# Patient Record
Sex: Female | Born: 1965 | Race: White | Hispanic: Yes | Smoking: Never smoker
Health system: Southern US, Community
[De-identification: ages and names within clinical notes are randomized; demographics above are authoritative.]

## PROBLEM LIST (undated history)

## (undated) DIAGNOSIS — D219 Benign neoplasm of connective and other soft tissue, unspecified: Secondary | ICD-10-CM

## (undated) DIAGNOSIS — I10 Essential (primary) hypertension: Secondary | ICD-10-CM

## (undated) DIAGNOSIS — D649 Anemia, unspecified: Secondary | ICD-10-CM

## (undated) HISTORY — DX: Anemia, unspecified: D64.9

---

## 1997-07-17 ENCOUNTER — Emergency Department (HOSPITAL_COMMUNITY): Admission: EM | Admit: 1997-07-17 | Discharge: 1997-07-17 | Payer: Self-pay | Admitting: Emergency Medicine

## 1999-07-01 ENCOUNTER — Emergency Department (HOSPITAL_COMMUNITY): Admission: EM | Admit: 1999-07-01 | Discharge: 1999-07-01 | Payer: Self-pay | Admitting: Emergency Medicine

## 1999-12-07 ENCOUNTER — Encounter: Admission: RE | Admit: 1999-12-07 | Discharge: 1999-12-07 | Payer: Self-pay | Admitting: Obstetrics & Gynecology

## 2004-05-09 ENCOUNTER — Emergency Department (HOSPITAL_COMMUNITY): Admission: EM | Admit: 2004-05-09 | Discharge: 2004-05-09 | Payer: Self-pay | Admitting: Emergency Medicine

## 2005-04-09 ENCOUNTER — Emergency Department (HOSPITAL_COMMUNITY): Admission: EM | Admit: 2005-04-09 | Discharge: 2005-04-09 | Payer: Self-pay | Admitting: Emergency Medicine

## 2009-06-07 ENCOUNTER — Emergency Department (HOSPITAL_COMMUNITY): Admission: EM | Admit: 2009-06-07 | Discharge: 2009-06-07 | Payer: Self-pay | Admitting: Emergency Medicine

## 2011-03-05 ENCOUNTER — Encounter (HOSPITAL_COMMUNITY): Payer: Self-pay | Admitting: *Deleted

## 2011-03-05 ENCOUNTER — Emergency Department (HOSPITAL_COMMUNITY)
Admission: EM | Admit: 2011-03-05 | Discharge: 2011-03-05 | Disposition: A | Discharge status: Home / Self Care | Payer: Self-pay | Attending: Emergency Medicine | Admitting: Emergency Medicine

## 2011-03-05 DIAGNOSIS — N898 Other specified noninflammatory disorders of vagina: Secondary | ICD-10-CM | POA: Insufficient documentation

## 2011-03-05 DIAGNOSIS — N939 Abnormal uterine and vaginal bleeding, unspecified: Secondary | ICD-10-CM

## 2011-03-05 LAB — CBC
HCT: 31.9 % — ABNORMAL LOW (ref 36.0–46.0)
Hemoglobin: 10.7 g/dL — ABNORMAL LOW (ref 12.0–15.0)
MCH: 27.2 pg (ref 26.0–34.0)
MCHC: 33.5 g/dL (ref 30.0–36.0)
MCV: 81 fL (ref 78.0–100.0)
Platelets: 364 10*3/uL (ref 150–400)
RBC: 3.94 MIL/uL (ref 3.87–5.11)
RDW: 13.6 % (ref 11.5–15.5)
WBC: 8.7 10*3/uL (ref 4.0–10.5)

## 2011-03-05 LAB — URINALYSIS, ROUTINE W REFLEX MICROSCOPIC
Bilirubin Urine: NEGATIVE
Glucose, UA: NEGATIVE mg/dL
Ketones, ur: NEGATIVE mg/dL
Leukocytes, UA: NEGATIVE
Nitrite: NEGATIVE
Protein, ur: NEGATIVE mg/dL
Specific Gravity, Urine: 1.016 (ref 1.005–1.030)
Urobilinogen, UA: 0.2 mg/dL (ref 0.0–1.0)
pH: 7.5 (ref 5.0–8.0)

## 2011-03-05 LAB — PREGNANCY, URINE: Preg Test, Ur: NEGATIVE

## 2011-03-05 LAB — WET PREP, GENITAL
Clue Cells Wet Prep HPF POC: NONE SEEN
Trich, Wet Prep: NONE SEEN
Yeast Wet Prep HPF POC: NONE SEEN

## 2011-03-05 LAB — URINE MICROSCOPIC-ADD ON

## 2011-03-05 MED ORDER — OXYCODONE-ACETAMINOPHEN 5-325 MG PO TABS: 1.0000 | ORAL_TABLET | ORAL | Status: AC | PRN

## 2011-03-05 NOTE — ED Notes (Signed)
Pt in and out cath due to vaginal bleeding

## 2011-03-05 NOTE — ED Notes (Signed)
Pt in c/o abnormal vaginal bleeding that started today, states she just finished her normal period, states bleeding is heavy

## 2011-03-05 NOTE — ED Notes (Signed)
Dr Juleen China bedside to perform pelvic exam

## 2011-03-06 LAB — GC/CHLAMYDIA PROBE AMP, GENITAL
Chlamydia, DNA Probe: NEGATIVE
GC Probe Amp, Genital: NEGATIVE

## 2011-03-13 NOTE — ED Provider Notes (Signed)
History     46 year old female with vaginal bleeding. Painless. Onset yesterday.worsening today. Patient says she just finished a normal period for her a few days prior to this. No urinary complaints. No dizziness, lightheadedness, or shortness of breath. Denies history of dysfunctional uterine bleeding. Does not think she is pregnant. No unusual bleeding anywhere else. Denies use of blood thinning medications.  CSN: [id number]  Arrival date & time 03/05/11  1728   First MD Initiated Contact with Patient 03/05/11 1823      Chief Complaint  Patient presents with  . Vaginal Bleeding    (Consider location/radiation/quality/duration/timing/severity/associated sxs/prior treatment) HPI  History reviewed. No pertinent past medical history.  History reviewed. No pertinent past surgical history.  History reviewed. No pertinent family history.  History  Substance Use Topics  . Smoking status: Not on file  . Smokeless tobacco: Not on file  . Alcohol Use: Not on file    OB History    Grav Para Term Preterm Abortions TAB SAB Ect Mult Living                  Review of Systems   Review of symptoms negative unless otherwise noted in HPI.   Allergies  Tylenol  Home Medications   Current Outpatient Rx  Name Route Sig Dispense Refill  . ADULT MULTIVITAMIN W/MINERALS CH Oral Take 1 tablet by mouth daily.    . OXYCODONE-ACETAMINOPHEN 5-325 MG PO TABS Oral Take 1 tablet by mouth every 4 (four) hours as needed for pain. 10 tablet 0    BP 160/83  Pulse 68  Temp(Src) 98.6 F (37 C) (Oral)  Resp 16  SpO2 100%  Physical Exam  Nursing note and vitals reviewed. Constitutional: She appears well-developed and well-nourished. No distress.  HENT:  Head: Normocephalic and atraumatic.  Eyes: Conjunctivae are normal. Right eye exhibits no discharge. Left eye exhibits no discharge.  Neck: Neck supple.  Cardiovascular: Normal rate, regular rhythm and normal heart sounds.  Exam reveals  no gallop and no friction rub.   No murmur heard. Pulmonary/Chest: Effort normal and breath sounds normal. No respiratory distress.  Abdominal: Soft. She exhibits no distension. There is no tenderness.  Genitourinary:       Normal external genitalia. Os closed. Dark red blood with small clots in vaginal vault. Small L adnexal mass appreciated with moderate tenderness. No cmt. No cervical lesions.  Musculoskeletal: She exhibits no edema and no tenderness.  Neurological: She is alert.  Skin: Skin is warm and dry.  Psychiatric: She has a normal mood and affect. Her behavior is normal. Thought content normal.    ED Course  Procedures (including critical care time)  Labs Reviewed  CBC - Abnormal; Notable for the following:    Hemoglobin 10.7 (*)    HCT 31.9 (*)    All other components within normal limits  URINALYSIS, ROUTINE W REFLEX MICROSCOPIC - Abnormal; Notable for the following:    APPearance TURBID (*)    Hgb urine dipstick TRACE (*)    All other components within normal limits  URINE MICROSCOPIC-ADD ON - Abnormal; Notable for the following:    Squamous Epithelial / LPF FEW (*)    Bacteria, UA FEW (*)    All other components within normal limits  WET PREP, GENITAL - Abnormal; Notable for the following:    WBC, Wet Prep HPF POC RARE (*)    All other components within normal limits  PREGNANCY, URINE  GC/CHLAMYDIA PROBE AMP, GENITAL  LAB REPORT -  SCANNED   No results found.   1. Vaginal bleeding       MDM  46 year old female with vaginal bleeding. Hemoglobin 10.7. Unclear baseline. Patient is hemodynamically stable. No complaints attributable to possible anemia. Normal exam is benign. No indication for emergent imaging at this time. Gynecological followup.        Raeford Razor, MD 03/13/11 1351

## 2011-06-04 ENCOUNTER — Ambulatory Visit: Payer: Self-pay | Admitting: Family Medicine

## 2011-06-04 VITALS — BP 131/82 | HR 87 | Temp 98.6°F | Resp 16 | Ht <= 58 in | Wt 128.4 lb

## 2011-06-04 DIAGNOSIS — J329 Chronic sinusitis, unspecified: Secondary | ICD-10-CM

## 2011-06-04 DIAGNOSIS — J40 Bronchitis, not specified as acute or chronic: Secondary | ICD-10-CM

## 2011-06-04 MED ORDER — FLUTICASONE PROPIONATE 50 MCG/ACT NA SUSP: 2.0000 | Freq: Every day | NASAL | Status: DC

## 2011-06-04 MED ORDER — BENZONATATE 100 MG PO CAPS: ORAL_CAPSULE | ORAL | Status: AC

## 2011-06-04 MED ORDER — AMOXICILLIN 875 MG PO TABS: 875.0000 mg | ORAL_TABLET | Freq: Two times a day (BID) | ORAL | Status: AC

## 2011-06-04 NOTE — Progress Notes (Signed)
Subjective: Patient has had upper respiratory congestion, headache, facial pain, fever, and cough for about 5 days. She's been wheezing at times. She does not smoke. She is blowing a lot of stuff out of her nose.  Objective: TMs normal generally she looks like she doesn't feel well throat clear sinuses tender. Neck supple without nodes. Chest clear.  Assessment: Sinusitis/bronchitis  Plan: Amoxicillin, Flonase, Tessalon

## 2011-06-04 NOTE — Patient Instructions (Signed)
Sinusitis (Sinusitis) Los senos paranasales son bolsas de aire que se encuentran dentro de los huesos de la cara. La aparicin de bacterias en los senos paranasales lleva a una infeccin. Esta se denomina sinusitis.Estas infecciones generalmente son el resultado de una obstruccin en los orificios que drenan los senos.  SNTOMAS Generalmente, segn que seno se infecte, habr diferentes reas en las que puede aparecer dolor.   Los senos maxilares generalmente producen dolor detrs de los ojos.   La sinusitis frontal ocasiona dolor en el medio de la frente y sobre los ojos.  Entre otros problemas (sntomas) se incluyen:  Dolor en la zona posterior de los dientes superiores.   Una secrecin similar al pus (purulenta) proveniente de la nariz.   Toda inflamacin, calor o sensibilidad en estas mismas reas son indicios de infeccin.  TRATAMIENTO La sinusitis se diagnostica a travs del examen fsico y radiografas. Si le han tomado radiografas, asegrese de retirar los resultados. O consulte el modo en que podr obtenerlos. Su mdico le prescribir medicamentos (antibiticos). Estos medicamentos se indican para combatir la infeccin. Tambin le prescribir un descongestivo para reducir la inflamacin del seno paranasal.  INSTRUCCIONES PARA EL CUIDADO DOMICILIARIO  Utilice los medicamentos de venta libre o de prescripcin para el dolor, el malestar o la fiebre, segn se lo indique el profesional que lo asiste.   Beba gran cantidad de lquidos. Los lquidos ayudan a que las mucosas de los senos nasales drenen ms fcilmente.   Aplique bolsas de calor hmedo o hielo en las zonas ms doloridas para aliviar las molestias.   Utilice Sprays nasales salinos para ayudar a humedecer los senos nasales. Estos pueden encontrarse en la farmacia local.  SOLICITE ATENCIN MDICA DE INMEDIATO SI:  Tiene fiebre.   Dolor en aumento, dolor de cabeza intenso o dolor de dientes.   Nuseas, vmitos o  sudoracin.   Hinchazn infrecuente alrededor del rostro o problemas en la visin.  EST SEGURO QUE:   Comprende las instrucciones para el alta mdica.   Controlar su enfermedad.   Solicitar atencin mdica de inmediato segn las indicaciones.  Document Released: 11/15/2004 Document Revised: 01/25/2011 [employer] Patient Information 2012 [employer]. 

## 2012-08-30 ENCOUNTER — Emergency Department (HOSPITAL_COMMUNITY)
Admission: EM | Admit: 2012-08-30 | Discharge: 2012-08-30 | Disposition: A | Discharge status: Home / Self Care | Payer: No Typology Code available for payment source | Attending: Emergency Medicine | Admitting: Emergency Medicine

## 2012-08-30 ENCOUNTER — Encounter (HOSPITAL_COMMUNITY): Payer: Self-pay | Admitting: *Deleted

## 2012-08-30 DIAGNOSIS — I10 Essential (primary) hypertension: Secondary | ICD-10-CM | POA: Insufficient documentation

## 2012-08-30 DIAGNOSIS — S0990XA Unspecified injury of head, initial encounter: Secondary | ICD-10-CM | POA: Insufficient documentation

## 2012-08-30 DIAGNOSIS — Y92410 Unspecified street and highway as the place of occurrence of the external cause: Secondary | ICD-10-CM | POA: Insufficient documentation

## 2012-08-30 DIAGNOSIS — M542 Cervicalgia: Secondary | ICD-10-CM

## 2012-08-30 DIAGNOSIS — S46909A Unspecified injury of unspecified muscle, fascia and tendon at shoulder and upper arm level, unspecified arm, initial encounter: Secondary | ICD-10-CM | POA: Insufficient documentation

## 2012-08-30 DIAGNOSIS — Y9389 Activity, other specified: Secondary | ICD-10-CM | POA: Insufficient documentation

## 2012-08-30 DIAGNOSIS — IMO0002 Reserved for concepts with insufficient information to code with codable children: Secondary | ICD-10-CM | POA: Insufficient documentation

## 2012-08-30 DIAGNOSIS — S0993XA Unspecified injury of face, initial encounter: Secondary | ICD-10-CM | POA: Insufficient documentation

## 2012-08-30 DIAGNOSIS — S199XXA Unspecified injury of neck, initial encounter: Secondary | ICD-10-CM | POA: Insufficient documentation

## 2012-08-30 DIAGNOSIS — S4980XA Other specified injuries of shoulder and upper arm, unspecified arm, initial encounter: Secondary | ICD-10-CM | POA: Insufficient documentation

## 2012-08-30 DIAGNOSIS — S298XXA Other specified injuries of thorax, initial encounter: Secondary | ICD-10-CM | POA: Insufficient documentation

## 2012-08-30 DIAGNOSIS — Z8742 Personal history of other diseases of the female genital tract: Secondary | ICD-10-CM | POA: Insufficient documentation

## 2012-08-30 DIAGNOSIS — M62838 Other muscle spasm: Secondary | ICD-10-CM | POA: Insufficient documentation

## 2012-08-30 DIAGNOSIS — Z79899 Other long term (current) drug therapy: Secondary | ICD-10-CM | POA: Insufficient documentation

## 2012-08-30 HISTORY — DX: Benign neoplasm of connective and other soft tissue, unspecified: D21.9

## 2012-08-30 HISTORY — DX: Essential (primary) hypertension: I10

## 2012-08-30 MED ORDER — TRAMADOL HCL 50 MG PO TABS: 50.0000 mg | ORAL_TABLET | Freq: Four times a day (QID) | ORAL | Status: DC | PRN

## 2012-08-30 MED ORDER — CYCLOBENZAPRINE HCL 10 MG PO TABS: 10.0000 mg | ORAL_TABLET | Freq: Two times a day (BID) | ORAL | Status: DC | PRN

## 2012-08-30 MED ORDER — CYCLOBENZAPRINE HCL 10 MG PO TABS
10.0000 mg | ORAL_TABLET | Freq: Once | ORAL | Status: AC
  Administered 2012-08-30: 10 mg via ORAL
  Filled 2012-08-30: qty 1
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
## 2012-08-31 NOTE — ED Provider Notes (Signed)
Medical screening examination/treatment/procedure(s) were performed by non-physician practitioner and as supervising physician I was immediately available for consultation/collaboration.   Richardean Canal, MD 08/31/12 [phone]

## 2013-11-15 ENCOUNTER — Emergency Department (HOSPITAL_COMMUNITY): Payer: 59

## 2013-11-15 ENCOUNTER — Encounter (HOSPITAL_COMMUNITY): Payer: Self-pay | Admitting: Emergency Medicine

## 2013-11-15 ENCOUNTER — Emergency Department (HOSPITAL_COMMUNITY)
Admission: EM | Admit: 2013-11-15 | Discharge: 2013-11-16 | Disposition: A | Discharge status: Home / Self Care | Payer: 59 | Attending: Emergency Medicine | Admitting: Emergency Medicine

## 2013-11-15 DIAGNOSIS — Z3202 Encounter for pregnancy test, result negative: Secondary | ICD-10-CM | POA: Diagnosis not present

## 2013-11-15 DIAGNOSIS — R1031 Right lower quadrant pain: Secondary | ICD-10-CM | POA: Diagnosis present

## 2013-11-15 DIAGNOSIS — N840 Polyp of corpus uteri: Secondary | ICD-10-CM | POA: Diagnosis not present

## 2013-11-15 DIAGNOSIS — E876 Hypokalemia: Secondary | ICD-10-CM

## 2013-11-15 DIAGNOSIS — K8020 Calculus of gallbladder without cholecystitis without obstruction: Secondary | ICD-10-CM | POA: Diagnosis not present

## 2013-11-15 DIAGNOSIS — Z79899 Other long term (current) drug therapy: Secondary | ICD-10-CM | POA: Diagnosis not present

## 2013-11-15 DIAGNOSIS — I10 Essential (primary) hypertension: Secondary | ICD-10-CM | POA: Diagnosis not present

## 2013-11-15 LAB — COMPREHENSIVE METABOLIC PANEL
ALT: 16 U/L (ref 0–35)
AST: 24 U/L (ref 0–37)
Albumin: 3.8 g/dL (ref 3.5–5.2)
Alkaline Phosphatase: 53 U/L (ref 39–117)
Anion gap: 13 (ref 5–15)
BUN: 12 mg/dL (ref 6–23)
CALCIUM: 8.9 mg/dL (ref 8.4–10.5)
CO2: 27 mEq/L (ref 19–32)
CREATININE: 0.64 mg/dL (ref 0.50–1.10)
Chloride: 99 mEq/L (ref 96–112)
GFR calc non Af Amer: >90 mL/min (ref >90)
Glucose, Bld: 115 mg/dL — ABNORMAL HIGH (ref 70–99)
POTASSIUM: 2.7 meq/L — AB (ref 3.7–5.3)
SODIUM: 139 meq/L (ref 137–147)
Total Bilirubin: 0.3 mg/dL (ref 0.3–1.2)
Total Protein: 7.8 g/dL (ref 6.0–8.3)

## 2013-11-15 LAB — CBC WITH DIFFERENTIAL/PLATELET
BASOS ABS: 0 10*3/uL (ref 0.0–0.1)
BASOS PCT: 0 % (ref 0–1)
Eosinophils Absolute: 0.1 10*3/uL (ref 0.0–0.7)
Eosinophils Relative: 1 % (ref 0–5)
HEMATOCRIT: 32.4 % — AB (ref 36.0–46.0)
Hemoglobin: 9.7 g/dL — ABNORMAL LOW (ref 12.0–15.0)
LYMPHS ABS: 1.7 10*3/uL (ref 0.7–4.0)
LYMPHS PCT: 21 % (ref 12–46)
MCH: 20.1 pg — AB (ref 26.0–34.0)
MCHC: 29.9 g/dL — AB (ref 30.0–36.0)
MCV: 67.2 fL — ABNORMAL LOW (ref 78.0–100.0)
MONO ABS: 1 10*3/uL (ref 0.1–1.0)
Monocytes Relative: 12 % (ref 3–12)
Neutro Abs: 5.4 10*3/uL (ref 1.7–7.7)
Neutrophils Relative %: 66 % (ref 43–77)
PLATELETS: 427 10*3/uL — AB (ref 150–400)
RBC: 4.82 MIL/uL (ref 3.87–5.11)
RDW: 16.8 % — AB (ref 11.5–15.5)
WBC: 8.2 10*3/uL (ref 4.0–10.5)

## 2013-11-15 LAB — URINALYSIS, ROUTINE W REFLEX MICROSCOPIC
GLUCOSE, UA: NEGATIVE mg/dL
KETONES UR: 15 mg/dL — AB
Leukocytes, UA: NEGATIVE
NITRITE: NEGATIVE
PH: 6 (ref 5.0–8.0)
Protein, ur: 30 mg/dL — AB
Specific Gravity, Urine: 1.031 — ABNORMAL HIGH (ref 1.005–1.030)
Urobilinogen, UA: 1 mg/dL (ref 0.0–1.0)

## 2013-11-15 LAB — URINE MICROSCOPIC-ADD ON

## 2013-11-15 LAB — PREGNANCY, URINE: PREG TEST UR: NEGATIVE

## 2013-11-15 MED ORDER — MORPHINE SULFATE 4 MG/ML IJ SOLN
4.0000 mg | Freq: Once | INTRAMUSCULAR | Status: AC
Start: 2013-11-15 — End: 2013-11-15
  Administered 2013-11-15: 4 mg via INTRAVENOUS
  Filled 2013-11-15: qty 1

## 2013-11-15 MED ORDER — IOHEXOL 300 MG/ML  SOLN
25.0000 mL | Freq: Once | INTRAMUSCULAR | Status: AC | PRN
  Administered 2013-11-15: 25 mL via ORAL

## 2013-11-15 MED ORDER — SODIUM CHLORIDE 0.9 % IV BOLUS (SEPSIS)
1000.0000 mL | Freq: Once | INTRAVENOUS | Status: AC
  Administered 2013-11-15: 1000 mL via INTRAVENOUS

## 2013-11-15 MED ORDER — POTASSIUM CHLORIDE CRYS ER 20 MEQ PO TBCR
40.0000 meq | EXTENDED_RELEASE_TABLET | Freq: Once | ORAL | Status: AC
  Administered 2013-11-15: 40 meq via ORAL
  Filled 2013-11-15: qty 2

## 2013-11-15 MED ORDER — IOHEXOL 300 MG/ML  SOLN
100.0000 mL | Freq: Once | INTRAMUSCULAR | Status: AC | PRN
  Administered 2013-11-15: 100 mL via INTRAVENOUS

## 2013-11-15 MED ORDER — SODIUM CHLORIDE 0.9 % IV SOLN
INTRAVENOUS | Status: DC
  Administered 2013-11-15: 125 mL/h via INTRAVENOUS

## 2013-11-15 MED ORDER — ONDANSETRON HCL 4 MG/2ML IJ SOLN
4.0000 mg | Freq: Once | INTRAMUSCULAR | Status: AC
  Administered 2013-11-15: 4 mg via INTRAVENOUS
  Filled 2013-11-15: qty 2

## 2013-11-15 MED ORDER — MORPHINE SULFATE 4 MG/ML IJ SOLN
4.0000 mg | Freq: Once | INTRAMUSCULAR | Status: AC
  Administered 2013-11-16: 4 mg via INTRAVENOUS
  Filled 2013-11-15: qty 1

## 2013-11-15 NOTE — ED Notes (Signed)
Reported critical K+ 2.7 to Dr Eulis Foster.

## 2013-11-15 NOTE — ED Notes (Signed)
CT informed that pt finished contrast.

## 2013-11-15 NOTE — ED Notes (Signed)
Called main lab, K+ has not resulted in lab CMP results. Verified that K+ is 2.7. Lab states that they will result the K+ now.

## 2013-11-15 NOTE — ED Notes (Signed)
Pt speaks no [place], daughter translating in triage. Pt presents with RLQ pain, nausea, vomiting, loose stool starting Friday. Pt denies anything to eat or drink since Friday and now experienceing dizziness, cold sweats, and headache. Pt was able to eat and drink today but began feeling worse. Pt denies any bloody stool, SOB, or chest pain

## 2013-11-15 NOTE — ED Provider Notes (Signed)
CSN: [id number]     Arrival date & time 11/15/13  1944 History   First MD Initiated Contact with Patient 11/15/13 1951     Chief Complaint  Patient presents with  . Abdominal Pain  . Nausea  . Emesis     (Consider location/radiation/quality/duration/timing/severity/associated sxs/prior Treatment) HPI  Ashley Bruce is a 48 y.o. female who presents for evaluation of nausea, vomiting, diarrhea, and right lower abdominal pain, for several days. She has anorexia. She's not taken any medicine for the problem at home. No other known modifying factors.   Past Medical History  Diagnosis Date  . Hypertension   . Fibroids    History reviewed. No pertinent past surgical history. History reviewed. No pertinent family history. History  Substance Use Topics  . Smoking status: Never Smoker   . Smokeless tobacco: Not on file  . Alcohol Use: No   OB History   Grav Para Term Preterm Abortions TAB SAB Ect Mult Living                 Review of Systems  All other systems reviewed and are negative.     Allergies  Tylenol  Home Medications   Prior to Admission medications   Medication Sig Start Date End Date Taking? Authorizing Provider  hydrochlorothiazide (HYDRODIURIL) 25 MG tablet Take 25 mg by mouth daily.   Yes Historical Provider, MD  tranexamic acid (LYSTEDA) 650 MG TABS Take 1,300 mg by mouth 3 (three) times daily. Only takes for 5 days of her menstrual cycle each month.   Yes Historical Provider, MD   BP 115/71  Pulse 73  Temp(Src) 98.2 F (36.8 C) (Oral)  Resp 18  Wt 122 lb 8 oz (55.566 kg)  SpO2 100%  LMP 10/30/2013 Physical Exam  Nursing note and vitals reviewed. Constitutional: She is oriented to person, place, and time. She appears well-developed and well-nourished.  HENT:  Head: Normocephalic and atraumatic.  Eyes: Conjunctivae and EOM are normal. Pupils are equal, round, and reactive to light.  Neck: Normal range of motion and phonation normal. Neck supple.   Cardiovascular: Normal rate and regular rhythm.   Pulmonary/Chest: Effort normal and breath sounds normal. She exhibits no tenderness.  Abdominal: Soft. She exhibits no distension and no mass. There is tenderness (right lower quadrant, moderate.). There is no rebound and no guarding.  Musculoskeletal: Normal range of motion.  Neurological: She is alert and oriented to person, place, and time. She exhibits normal muscle tone.  Skin: Skin is warm and dry.  Psychiatric: She has a normal mood and affect. Her behavior is normal. Judgment and thought content normal.    ED Course  Procedures (including critical care time) Medications  0.9 %  sodium chloride infusion (125 mL/hr Intravenous New Bag/Given 11/15/13 2142)  potassium chloride SA (K-DUR,KLOR-CON) CR tablet 40 mEq (40 mEq Oral Given 11/15/13 2138)  sodium chloride 0.9 % bolus 1,000 mL (1,000 mLs Intravenous New Bag/Given 11/15/13 2133)  morphine 4 MG/ML injection 4 mg (4 mg Intravenous Given 11/15/13 2135)  ondansetron (ZOFRAN) injection 4 mg (4 mg Intravenous Given 11/15/13 2135)  iohexol (OMNIPAQUE) 300 MG/ML solution 25 mL (25 mLs Oral Contrast Given 11/15/13 2155)  iohexol (OMNIPAQUE) 300 MG/ML solution 100 mL (100 mLs Intravenous Contrast Given 11/15/13 2256)    Patient Vitals for the past 24 hrs:  BP Temp Temp src Pulse Resp SpO2 Weight  11/15/13 2310 115/71 mmHg - - 73 18 100 % -  11/15/13 2230 120/76 mmHg - - 65  15 100 % -  11/15/13 2200 123/78 mmHg - - 87 17 100 % -  11/15/13 2130 129/73 mmHg - - 73 14 100 % -  11/15/13 2100 110/62 mmHg - - 72 14 99 % -  11/15/13 2030 114/69 mmHg - - 70 15 100 % -  11/15/13 1950 129/74 mmHg 98.2 F (36.8 C) Oral 82 16 97 % 122 lb 8 oz (55.566 kg)    11:21 PM Reevaluation with update and discussion. After initial assessment and treatment, an updated evaluation reveals no further complaints. Updated on finding so far. CT imaging shows right upper quadrant, gallbladder disease, and possible  uterine abnormality that is not clearly defined. Patient agrees to proceeding with ultrasound imaging of the pelvis for clarification of the pelvic structure abnormalities seen on CT imaging. Jimma Ortman L   Labs Review Labs Reviewed  CBC WITH DIFFERENTIAL - Abnormal; Notable for the following:    Hemoglobin 9.7 (*)    HCT 32.4 (*)    MCV 67.2 (*)    MCH 20.1 (*)    MCHC 29.9 (*)    RDW 16.8 (*)    Platelets 427 (*)    All other components within normal limits  COMPREHENSIVE METABOLIC PANEL - Abnormal; Notable for the following:    Potassium 2.7 (*)    Glucose, Bld 115 (*)    All other components within normal limits  URINALYSIS, ROUTINE W REFLEX MICROSCOPIC - Abnormal; Notable for the following:    APPearance CLOUDY (*)    Specific Gravity, Urine 1.031 (*)    Hgb urine dipstick MODERATE (*)    Bilirubin Urine SMALL (*)    Ketones, ur 15 (*)    Protein, ur 30 (*)    All other components within normal limits  URINE MICROSCOPIC-ADD ON - Abnormal; Notable for the following:    Squamous Epithelial / LPF FEW (*)    Bacteria, UA FEW (*)    All other components within normal limits  PREGNANCY, URINE    Imaging Review Ct Abdomen Pelvis W Contrast  11/15/2013   CLINICAL DATA:  Right lower quadrant pain, nausea, vomiting, loose stools starting Friday.  EXAM: CT ABDOMEN AND PELVIS WITH CONTRAST  TECHNIQUE: Multidetector CT imaging of the abdomen and pelvis was performed using the standard protocol following bolus administration of intravenous contrast.  CONTRAST:  138mL OMNIPAQUE IOHEXOL 300 MG/ML  SOLN  COMPARISON:  None.  FINDINGS: Mild dependent changes in the lung bases.  The liver, spleen, pancreas, adrenal glands, kidneys, abdominal aorta, inferior vena cava, and retroperitoneal lymph nodes are unremarkable. Cholelithiasis without gallbladder wall thickening or inflammation. Stomach and small bowel are unremarkable. Scattered stool in the colon without distention. No free air or free  fluid in the abdomen. Minimal umbilical hernia containing fat.  Pelvis: The uterus is enlarged and anteverted. Prominent expansion of the endometrium with heterogeneity enhancing material present. Endometrial hyperplasia or neoplasm should be excluded. Recommend ultrasound for further evaluation. Cysts in the left ovary measuring 4.1 cm maximal diameter could also be evaluated at ultrasound. No free or loculated pelvic fluid collections. Appendix is normal. No evidence of diverticulitis. Mild degenerative changes in the lumbar spine. No destructive bone lesions.  IMPRESSION: Cholelithiasis without additional inflammatory changes in the gallbladder. Normal appendix. Expanded endometrium with heterogeneous enhancing material. Endometrial polyp or neoplasm should be excluded and ultrasound is recommended. 4.1 cm left ovarian cyst could also be evaluated at sonography.   Electronically Signed   By: Lucienne Capers M.D.   On:  11/15/2013 23:02     EKG Interpretation None      MDM   Final diagnoses:  Calculus of gallbladder without cholecystitis without obstruction  Hypokalemia    Abdominal pain, lower, CT imaging indicates gallbladder disease. Further imaging required with ultrasound to evaluate the pelvic cavity structures. The patient has incidental hypokalemia; this is likely related to her use of diuretic, for blood pressure.  Nursing Notes Reviewed/ Care Coordinated, and agree without changes. Applicable Imaging Reviewed.  Interpretation of Laboratory Data incorporated into ED treatment  Richarda Blade, MD 11/18/13 [phone]

## 2013-11-16 MED ORDER — HYDROCODONE-ACETAMINOPHEN 5-325 MG PO TABS: 1.0000 | ORAL_TABLET | ORAL | Status: DC | PRN

## 2013-11-16 MED ORDER — POTASSIUM CHLORIDE CRYS ER 20 MEQ PO TBCR: 20.0000 meq | EXTENDED_RELEASE_TABLET | Freq: Every day | ORAL | Status: DC

## 2013-11-16 NOTE — Discharge Instructions (Signed)
Clico biliar (Biliary Colic)  El clico biliar es un dolor continuo o irregular en la zona superior del abdomen. Generalmente se ubica debajo de la zona derecha de la caja torcica. Aparece cuando los clculos biliares interfieren con el flujo normal de la bilis que proviene de la vescula. La bilis es un lquido que interviene en la digestin de las [place]. Se produce en el hgado y se almacena en la vescula. Al comer, La bilis pasa desde la vescula, a travs del conducto cstico y el conducto biliar comn al intestino delgado. All se mezcla con la comida parcialmente digerida. Si un clculo obstruye alguno de esos conductos, se detiene el flujo normal de bilis. Las clulas del conducto biliar se contraen con fuerza para mover el clculo. Esto causa el dolor del clico biliar.  SNTOMAS  El paciente se queja de dolor en la zona superior del abdomen. El dolor puede ser:  En el centro de la zona superior del abdomen, justo por debajo del esternn.  En la zona superior derecha del abdomen, donde se encuentra la vescula biliar y el hgado.  Se expande hacia la espalda, hacia el omplato derecho.  Nuseas y vmitos  El dolor comienza generalmente despus de comer.  El clico biliar aparece como una demanda de bilis por parte del sistema digestivo. La demanda de bilis es mayor luego de ingerir alimentos ricos en grasas. Los sntomas tambin Geophysicist/field seismologist que han estado ayunando e ingieren abruptamente una comida abundante. La mayora de los episodios de clico biliar mejoran luego de 1 a 5 horas. Despus que se alivia el dolor ms intenso, podr seguir sintiendo un dolor moderado en el abdomen durante un lapso de 24 horas. DIAGNSTICO Luego de escuchar la descripcin de los sntomas, el mdico realizar un examen fsico. Deber prestar atencin a la zona superior del abdomen. Esta es la zona en la que se encuentra el hgado y la vescula biliar. El mdico podr observar los clculos  a travs de una ecografa. Tambin le realizaran un escaneo especializado de la vescula biliar. Le indicarn anlisis de [place], especialmente si tiene fiebre o el dolor persiste. PREVENCIN El clico biliar puede evitarse controlando los factores de riesgo que favorecen los clculos. Algunos de [employer] factores de riesgo como la herencia, el aumento de la edad y Water quality scientist son aspectos normales de la vida. La obesidad y [place] dieta rica en grasas son factores de riesgo que usted puede modificar a travs de cambios hacia un estilo de vida saludable. Las mujeres que atraviesan la menopausia y que reciben terapia de reemplazo hormonal (estrgenos) tambin tienen ms riesgo de Actor clicos biliares. TRATAMIENTO  Le prescribirn analgsicos.  Le indicarn una dieta sin grasas.  Si el primer episodio es intenso, o si los clicos aparecen nuevamente, generalmente se indica la ciruga para extirpar la vescula (colecistectoma). Este procedimiento puede realizarse a travs de pequeas incisiones utilizando un instrumento denominado laparoscopio. Muchas veces se requiere una breve estada en el hospital. Algunas personas reciben el alta el mismo da. Es el tratamiento ms ampliamente utilizado en personas que sufren dolor por clculos biliares. Es efectivo y seguro, no tiene complicaciones en ms del 90% de los [place].  Si la ciruga no puede llevarse a cabo, podrn utilizarse medicamentos para [employer] clculos. Esta medicacin es cara y puede demorar meses o aos hasta que [place]. Slo podr disolver clculos pequeos.  En algunos casos raros, se combinan estos medicamentos con un procedimiento denominado litotricia por [employer]  de choque. Este procedimiento [place] ondas de choque cuidadosamente dirigidas a romper los clculos. En muchas personas tratadas con este procedimiento, los clculos vuelven a formarse luego de [employer]. PRONSTICO Si los clculos obstruyen el conducto cstico o  conducto biliar comn, usted tiene el riesgo de sufrir episodios repetidos de clicos biliares. Tambin existe un 25% de probabilidades de desarrollar una infeccin de la vescula biliar (colecistitisaguda) o alguna otra complicacin en los siguientes 10 a 20 aos. Si ha sido sometido a [place], progrmela para el momento en que sea conveniente para usted, y para cuando no se encuentre enfermo. INSTRUCCIONES PARA EL CUIDADO DOMICILIARIO  Beba gran cantidad de lquidos claros.  Evite las comidas con mucha grasa o fritas, o cualquier alimento que empeore su dolor.  Tome los medicamentos como se le indic. SOLICITE ATENCIN MDICA SI:  Le sube la temperatura a ms de 100.5 F (38.1 C).  El dolor empeora con el [place].  Siente nuseas y [employer] comer y beber.  Tiene vmitos. SOLICITE ATENCIN MDICA DE INMEDIATO SI:  Siente dolor continuo e intenso en el abdomen, que no se alivia con medicamentos.  Siente nuseas y vmitos que no mejoran con medicamentos.  Tiene sntomas de clico biliar y comienza a Irene Shipper y escalofros. Esto puede ser un indicio de que ha desarrollado colecistitis. Comunquese con su mdico inmediatamente.  Su piel o la parte blanca del ojo se vuelven amarillas (ictericia). Document Released: 05/15/2007 Document Revised: 04/30/2011 [facility] Patient Information 2015 [place]. This information is not intended to replace advice given to you by your health care provider. Make sure you discuss any questions you have with your health care provider.  Hipokalemia ( Hypokalemia) Hipokalemia significa que el nivel de potasio en sangre es menor que lo normal. El potasio es un electrolito que ayuda a regular la cantidad de lquido del organismo. Tambin estimula la contraccin muscular y ayuda a que la funcin muscular sea la [place]. La Delorise Shiner del potasio del organismo se encuentra dentro de las clulas y slo una pequea cantidad en la sangre. Debido a  que la cantidad en la sangre es muy pequea, pequeos cambios en la sangre pueden poner en peligro la vida. CAUSAS  Antibiticos.  Diarrea o vmitos.  El uso excesivo de laxantes, lo que puede causar diarrea.  Enfermedad renal crnica.  Uso de diurticos.  Trastornos de Youth worker (bulimia).  Bajos niveles de magnesio.  Sudoracin abundante. [place].  Estreimiento.  Fatiga.  Calambres musculares.  Confusin mental.  Latidos cardacos salteados o irregulares (palpitaciones).  Hormigueo o adormecimiento. DIAGNSTICO  El mdico puede diagnosticar hipokalemia por los anlisis de [place]. Adems para controlar sus niveles de potasio, el mdico podr ordenar otros anlisis de laboratorio. TRATAMIENTO La hipokalemia puede tratarse con suplementos de potasio por va oral o realizando ajustes en sus medicamentos habituales. Si sus niveles de potasio son muy bajos, ser necesario que lo reciba a travs de una vena (IV) y se lo controle en el hospital. Ardelia Mems dieta rica en potasio tambin puede ser de [place]. Los alimentos ricos en potasio son:  [place] Pert secos, como cacahuetes y pistachos.  Semillas, como semillas de girasol y de Scientific laboratory technician.  Porotos, guisantes secos y lentejas.  Granos enteros y panes y cereales con salvado.  Lambert Mody y vegetales frescos como damascos, avocado, bananas, meln, kiwi, naranjas, esprragos y patatas.  Jugos de naranja y tomates.  Carnes rojas.  Yogur con frutas. [place]  Tome todos los medicamentos como le indic el mdico.  Siga una dieta saludable e incluya alimentos nutritivos como frutas, vegetales, nueces, granos enteros y carnes [place].  Si est tomando laxantes, asegrese de seguir las instrucciones del envase. SOLICITE ATENCIN MDICA SI:  La debilidad empeora.  Siente que el corazn late fuerte o est acelerado.  Vomita o tiene diarrea.  Tiene problemas para mantener su  nivel de glucosa en el rango normal. SOLICITE ATENCIN MDICA DE INMEDIATO SI:  Siente dolor en el pecho, le falta de aire o se siente mareado.  Vomita o tiene diarrea durante ms de 2 das.  Se desmaya. ASEGRESE DE QUE:   Comprende estas instrucciones.  Controlar su afeccin.  Recibir ayuda de inmediato si no mejora o si empeora. Document Released: 02/05/2005 Document Revised: 11/26/2012 [facility] Patient Information 2015 [place], [place]. This information is not intended to replace advice given to you by your health care provider. Make sure you discuss any questions you have with your health care provider.

## 2013-12-03 ENCOUNTER — Other Ambulatory Visit (INDEPENDENT_AMBULATORY_CARE_PROVIDER_SITE_OTHER): Payer: Self-pay | Admitting: Surgery

## 2013-12-04 NOTE — Progress Notes (Addendum)
I was unable to reach patient by phone.  Assunta Found # [phone] left  A message on voice mail.   Instructed the patient to arrive at [place] entrance at 7:50, nothing to eat or drink after midnight.   Instructed the patient to take the following medications in the am with just enough water to get them down: Pain medication. Asked the patient to call [phone], in the am if there were any questions or problems.  Monday AM.

## 2013-12-06 MED ORDER — CEFAZOLIN SODIUM-DEXTROSE 2-3 GM-% IV SOLR: 2.0000 g | INTRAVENOUS | Status: DC

## 2013-12-07 ENCOUNTER — Ambulatory Visit (HOSPITAL_COMMUNITY): Payer: 59

## 2013-12-07 ENCOUNTER — Encounter (HOSPITAL_COMMUNITY)
Admission: RE | Disposition: A | Discharge status: Home / Self Care | Payer: Self-pay | Source: Ambulatory Visit | Attending: Surgery

## 2013-12-07 ENCOUNTER — Ambulatory Visit (HOSPITAL_COMMUNITY): Payer: 59 | Admitting: Anesthesiology

## 2013-12-07 ENCOUNTER — Observation Stay (HOSPITAL_COMMUNITY)
Admission: RE | Admit: 2013-12-07 | Discharge: 2013-12-09 | Disposition: A | Discharge status: Home / Self Care | Payer: 59 | Source: Ambulatory Visit | Attending: Surgery | Admitting: Surgery

## 2013-12-07 ENCOUNTER — Encounter (HOSPITAL_COMMUNITY): Payer: Self-pay | Admitting: Anesthesiology

## 2013-12-07 ENCOUNTER — Encounter (HOSPITAL_COMMUNITY): Payer: 59 | Admitting: Anesthesiology

## 2013-12-07 DIAGNOSIS — I509 Heart failure, unspecified: Secondary | ICD-10-CM | POA: Diagnosis not present

## 2013-12-07 DIAGNOSIS — Z23 Encounter for immunization: Secondary | ICD-10-CM | POA: Insufficient documentation

## 2013-12-07 DIAGNOSIS — K8010 Calculus of gallbladder with chronic cholecystitis without obstruction: Secondary | ICD-10-CM | POA: Diagnosis not present

## 2013-12-07 DIAGNOSIS — E876 Hypokalemia: Secondary | ICD-10-CM | POA: Insufficient documentation

## 2013-12-07 DIAGNOSIS — I252 Old myocardial infarction: Secondary | ICD-10-CM | POA: Insufficient documentation

## 2013-12-07 DIAGNOSIS — Z9049 Acquired absence of other specified parts of digestive tract: Secondary | ICD-10-CM

## 2013-12-07 DIAGNOSIS — K8020 Calculus of gallbladder without cholecystitis without obstruction: Secondary | ICD-10-CM | POA: Diagnosis present

## 2013-12-07 DIAGNOSIS — Z01811 Encounter for preprocedural respiratory examination: Secondary | ICD-10-CM

## 2013-12-07 DIAGNOSIS — I10 Essential (primary) hypertension: Secondary | ICD-10-CM | POA: Diagnosis not present

## 2013-12-07 HISTORY — PX: CHOLECYSTECTOMY: SHX55

## 2013-12-07 LAB — CBC
HCT: 28.7 % — ABNORMAL LOW (ref 36.0–46.0)
Hemoglobin: 8.6 g/dL — ABNORMAL LOW (ref 12.0–15.0)
MCH: 19.6 pg — ABNORMAL LOW (ref 26.0–34.0)
MCHC: 30 g/dL (ref 30.0–36.0)
MCV: 65.5 fL — AB (ref 78.0–100.0)
PLATELETS: 435 10*3/uL — AB (ref 150–400)
RBC: 4.38 MIL/uL (ref 3.87–5.11)
RDW: 17 % — AB (ref 11.5–15.5)
WBC: 6.3 10*3/uL (ref 4.0–10.5)

## 2013-12-07 LAB — POCT I-STAT 4, (NA,K, GLUC, HGB,HCT)
Glucose, Bld: 112 mg/dL — ABNORMAL HIGH (ref 70–99)
HCT: 29 % — ABNORMAL LOW (ref 36.0–46.0)
Hemoglobin: 9.9 g/dL — ABNORMAL LOW (ref 12.0–15.0)
POTASSIUM: 2.7 meq/L — AB (ref 3.7–5.3)
Sodium: 140 mEq/L (ref 137–147)

## 2013-12-07 LAB — BASIC METABOLIC PANEL
ANION GAP: 13 (ref 5–15)
BUN: 6 mg/dL (ref 6–23)
CALCIUM: 8.9 mg/dL (ref 8.4–10.5)
CO2: 26 mEq/L (ref 19–32)
Chloride: 102 mEq/L (ref 96–112)
Creatinine, Ser: 0.54 mg/dL (ref 0.50–1.10)
GFR calc Af Amer: >90 mL/min (ref >90)
GFR calc non Af Amer: >90 mL/min (ref >90)
GLUCOSE: 87 mg/dL (ref 70–99)
POTASSIUM: 2.8 meq/L — AB (ref 3.7–5.3)
SODIUM: 141 meq/L (ref 137–147)

## 2013-12-07 LAB — HCG, SERUM, QUALITATIVE: Preg, Serum: NEGATIVE

## 2013-12-07 SURGERY — LAPAROSCOPIC CHOLECYSTECTOMY WITH INTRAOPERATIVE CHOLANGIOGRAM
Anesthesia: General

## 2013-12-07 MED ORDER — LACTATED RINGERS IV SOLN
INTRAVENOUS | Status: DC
  Administered 2013-12-07: 50 mL/h via INTRAVENOUS
  Administered 2013-12-07: 1000 mL via INTRAVENOUS
  Administered 2013-12-08: 17:00:00 via INTRAVENOUS

## 2013-12-07 MED ORDER — OXYCODONE HCL 5 MG PO TABS: 5.0000 mg | ORAL_TABLET | ORAL | Status: DC | PRN

## 2013-12-07 MED ORDER — PROPOFOL 10 MG/ML IV BOLUS
INTRAVENOUS | Status: AC
  Filled 2013-12-07: qty 20

## 2013-12-07 MED ORDER — OXYCODONE HCL 5 MG PO TABS
ORAL_TABLET | ORAL | Status: AC
  Filled 2013-12-07: qty 1

## 2013-12-07 MED ORDER — CEFAZOLIN SODIUM-DEXTROSE 2-3 GM-% IV SOLR
INTRAVENOUS | Status: AC
  Administered 2013-12-07: 2 g via INTRAVENOUS
  Filled 2013-12-07: qty 50

## 2013-12-07 MED ORDER — BUPIVACAINE-EPINEPHRINE 0.25% -1:200000 IJ SOLN
INTRAMUSCULAR | Status: DC | PRN
  Administered 2013-12-07: 20 mL

## 2013-12-07 MED ORDER — LIDOCAINE HCL (CARDIAC) 20 MG/ML IV SOLN
INTRAVENOUS | Status: AC
  Filled 2013-12-07: qty 5

## 2013-12-07 MED ORDER — OXYCODONE HCL 5 MG PO TABS
5.0000 mg | ORAL_TABLET | Freq: Once | ORAL | Status: AC | PRN
  Administered 2013-12-07: 5 mg via ORAL

## 2013-12-07 MED ORDER — LIDOCAINE HCL (CARDIAC) 20 MG/ML IV SOLN
INTRAVENOUS | Status: DC | PRN
  Administered 2013-12-07: 60 mg via INTRAVENOUS

## 2013-12-07 MED ORDER — FENTANYL CITRATE 0.05 MG/ML IJ SOLN
INTRAMUSCULAR | Status: AC
  Filled 2013-12-07: qty 5

## 2013-12-07 MED ORDER — POTASSIUM CHLORIDE 10 MEQ/100ML IV SOLN
10.0000 meq | INTRAVENOUS | Status: DC
  Administered 2013-12-07 (×4): 10 meq via INTRAVENOUS
  Filled 2013-12-07 (×4): qty 100

## 2013-12-07 MED ORDER — ONDANSETRON HCL 4 MG/2ML IJ SOLN
INTRAMUSCULAR | Status: AC
  Filled 2013-12-07: qty 2

## 2013-12-07 MED ORDER — PROPOFOL 10 MG/ML IV BOLUS
INTRAVENOUS | Status: DC | PRN
  Administered 2013-12-07: 130 mg via INTRAVENOUS

## 2013-12-07 MED ORDER — HYDROMORPHONE HCL 1 MG/ML IJ SOLN
INTRAMUSCULAR | Status: AC
  Administered 2013-12-07: 0.5 mg via INTRAVENOUS
  Filled 2013-12-07: qty 1

## 2013-12-07 MED ORDER — ONDANSETRON HCL 4 MG/2ML IJ SOLN
INTRAMUSCULAR | Status: DC | PRN
  Administered 2013-12-07: 4 mg via INTRAVENOUS

## 2013-12-07 MED ORDER — POTASSIUM CHLORIDE IN NACL 20-0.9 MEQ/L-% IV SOLN
INTRAVENOUS | Status: DC
  Administered 2013-12-07 – 2013-12-08 (×2): via INTRAVENOUS
  Filled 2013-12-07 (×3): qty 1000

## 2013-12-07 MED ORDER — SODIUM CHLORIDE 0.9 % IR SOLN
Status: DC | PRN
  Administered 2013-12-07: 1000 mL

## 2013-12-07 MED ORDER — NEOSTIGMINE METHYLSULFATE 10 MG/10ML IV SOLN
INTRAVENOUS | Status: DC | PRN
  Administered 2013-12-07: 4 mg via INTRAVENOUS

## 2013-12-07 MED ORDER — HYDROMORPHONE HCL 1 MG/ML IJ SOLN
0.2500 mg | INTRAMUSCULAR | Status: DC | PRN
  Administered 2013-12-07 (×7): 0.5 mg via INTRAVENOUS

## 2013-12-07 MED ORDER — SODIUM CHLORIDE 0.9 % IV SOLN
INTRAVENOUS | Status: DC | PRN
  Administered 2013-12-07: 11:00:00

## 2013-12-07 MED ORDER — ONDANSETRON HCL 4 MG/2ML IJ SOLN: 4.0000 mg | Freq: Four times a day (QID) | INTRAMUSCULAR | Status: DC | PRN

## 2013-12-07 MED ORDER — POTASSIUM CHLORIDE 10 MEQ/100ML IV SOLN
INTRAVENOUS | Status: DC | PRN
  Administered 2013-12-07: 10 meq via INTRAVENOUS

## 2013-12-07 MED ORDER — ONDANSETRON HCL 4 MG PO TABS: 4.0000 mg | ORAL_TABLET | Freq: Four times a day (QID) | ORAL | Status: DC | PRN

## 2013-12-07 MED ORDER — OXYCODONE HCL 5 MG PO TABS
5.0000 mg | ORAL_TABLET | ORAL | Status: DC | PRN
  Administered 2013-12-07 – 2013-12-08 (×5): 10 mg via ORAL
  Administered 2013-12-09: 5 mg via ORAL
  Filled 2013-12-07: qty 2
  Filled 2013-12-07: qty 1
  Filled 2013-12-07 (×4): qty 2

## 2013-12-07 MED ORDER — HYDROMORPHONE HCL 1 MG/ML IJ SOLN
INTRAMUSCULAR | Status: AC
  Filled 2013-12-07: qty 1

## 2013-12-07 MED ORDER — GLYCOPYRROLATE 0.2 MG/ML IJ SOLN
INTRAMUSCULAR | Status: DC | PRN
  Administered 2013-12-07: 0.6 mg via INTRAVENOUS

## 2013-12-07 MED ORDER — SUCCINYLCHOLINE CHLORIDE 20 MG/ML IJ SOLN
INTRAMUSCULAR | Status: AC
  Filled 2013-12-07: qty 1

## 2013-12-07 MED ORDER — 0.9 % SODIUM CHLORIDE (POUR BTL) OPTIME
TOPICAL | Status: DC | PRN
  Administered 2013-12-07: 1000 mL

## 2013-12-07 MED ORDER — MORPHINE SULFATE 2 MG/ML IJ SOLN: 1.0000 mg | INTRAMUSCULAR | Status: DC | PRN

## 2013-12-07 MED ORDER — ENOXAPARIN SODIUM 40 MG/0.4ML ~~LOC~~ SOLN
40.0000 mg | SUBCUTANEOUS | Status: DC
  Administered 2013-12-08 – 2013-12-09 (×2): 40 mg via SUBCUTANEOUS
  Filled 2013-12-07 (×3): qty 0.4

## 2013-12-07 MED ORDER — MIDAZOLAM HCL 2 MG/2ML IJ SOLN
INTRAMUSCULAR | Status: AC
  Filled 2013-12-07: qty 2

## 2013-12-07 MED ORDER — ROCURONIUM BROMIDE 100 MG/10ML IV SOLN
INTRAVENOUS | Status: DC | PRN
  Administered 2013-12-07: 30 mg via INTRAVENOUS

## 2013-12-07 MED ORDER — FENTANYL CITRATE 0.05 MG/ML IJ SOLN
INTRAMUSCULAR | Status: DC | PRN
  Administered 2013-12-07: 100 ug via INTRAVENOUS

## 2013-12-07 MED ORDER — POTASSIUM CHLORIDE 10 MEQ/100ML IV SOLN
10.0000 meq | Freq: Once | INTRAVENOUS | Status: AC
  Administered 2013-12-07: 10 meq via INTRAVENOUS

## 2013-12-07 MED ORDER — OXYCODONE HCL 5 MG/5ML PO SOLN: 5.0000 mg | Freq: Once | ORAL | Status: AC | PRN

## 2013-12-07 SURGICAL SUPPLY — 42 items
APL SKNCLS STERI-STRIP NONHPOA (GAUZE/BANDAGES/DRESSINGS) ×1
APPLIER CLIP 5 13 M/L LIGAMAX5 (MISCELLANEOUS) ×3
APR CLP MED LRG 5 ANG JAW (MISCELLANEOUS) ×1
BAG SPEC RTRVL LRG 6X4 10 (ENDOMECHANICALS) ×1
BANDAGE ADH SHEER 1  50/CT (GAUZE/BANDAGES/DRESSINGS) ×12 IMPLANT
BENZOIN TINCTURE PRP APPL 2/3 (GAUZE/BANDAGES/DRESSINGS) ×3 IMPLANT
CANISTER SUCTION 2500CC (MISCELLANEOUS) ×3 IMPLANT
CHLORAPREP W/TINT 26ML (MISCELLANEOUS) ×3 IMPLANT
CLIP APPLIE 5 13 M/L LIGAMAX5 (MISCELLANEOUS) ×1 IMPLANT
CLOSURE WOUND 1/2 X4 (GAUZE/BANDAGES/DRESSINGS) ×1
COVER MAYO STAND STRL (DRAPES) ×3 IMPLANT
COVER SURGICAL LIGHT HANDLE (MISCELLANEOUS) ×3 IMPLANT
DRAPE C-ARM 42X72 X-RAY (DRAPES) ×3 IMPLANT
DRAPE LAPAROSCOPIC ABDOMINAL (DRAPES) ×3 IMPLANT
DRAPE UTILITY 15X26 W/TAPE STR (DRAPE) ×6 IMPLANT
ELECT REM PT RETURN 9FT ADLT (ELECTROSURGICAL) ×3
ELECTRODE REM PT RTRN 9FT ADLT (ELECTROSURGICAL) ×1 IMPLANT
GLOVE BIO SURGEON STRL SZ 6.5 (GLOVE) ×1 IMPLANT
GLOVE BIO SURGEONS STRL SZ 6.5 (GLOVE) ×1
GLOVE SURG SIGNA 7.5 PF LTX (GLOVE) ×3 IMPLANT
GOWN STRL REUS W/ TWL LRG LVL3 (GOWN DISPOSABLE) ×2 IMPLANT
GOWN STRL REUS W/ TWL XL LVL3 (GOWN DISPOSABLE) ×1 IMPLANT
GOWN STRL REUS W/TWL LRG LVL3 (GOWN DISPOSABLE) ×6
GOWN STRL REUS W/TWL XL LVL3 (GOWN DISPOSABLE) ×3
KIT BASIN OR (CUSTOM PROCEDURE TRAY) ×3 IMPLANT
KIT ROOM TURNOVER OR (KITS) ×3 IMPLANT
NS IRRIG 1000ML POUR BTL (IV SOLUTION) ×3 IMPLANT
PAD ARMBOARD 7.5X6 YLW CONV (MISCELLANEOUS) ×3 IMPLANT
POUCH SPECIMEN RETRIEVAL 10MM (ENDOMECHANICALS) ×3 IMPLANT
SCISSORS LAP 5X35 DISP (ENDOMECHANICALS) ×3 IMPLANT
SET CHOLANGIOGRAPH 5 50 .035 (SET/KITS/TRAYS/PACK) ×3 IMPLANT
SET IRRIG TUBING LAPAROSCOPIC (IRRIGATION / IRRIGATOR) ×3 IMPLANT
SLEEVE ENDOPATH XCEL 5M (ENDOMECHANICALS) ×6 IMPLANT
SPECIMEN JAR SMALL (MISCELLANEOUS) ×3 IMPLANT
STRIP CLOSURE SKIN 1/2X4 (GAUZE/BANDAGES/DRESSINGS) ×1 IMPLANT
SUT MON AB 4-0 PC3 18 (SUTURE) ×3 IMPLANT
TOWEL OR 17X24 6PK STRL BLUE (TOWEL DISPOSABLE) ×3 IMPLANT
TOWEL OR 17X26 10 PK STRL BLUE (TOWEL DISPOSABLE) ×3 IMPLANT
TRAY LAPAROSCOPIC (CUSTOM PROCEDURE TRAY) ×3 IMPLANT
TROCAR XCEL BLUNT TIP 100MML (ENDOMECHANICALS) ×3 IMPLANT
TROCAR XCEL NON-BLD 5MMX100MML (ENDOMECHANICALS) ×3 IMPLANT
TUBING INSUFFLATION (TUBING) ×3 IMPLANT

## 2013-12-07 NOTE — Anesthesia Procedure Notes (Signed)
Procedure Name: Intubation Date/Time: 12/07/2013 10:59 AM Performed by: Kyung Rudd Pre-anesthesia Checklist: Patient identified, Emergency Drugs available, Suction available and Patient being monitored Patient Re-evaluated:Patient Re-evaluated prior to inductionOxygen Delivery Method: Circle system utilized Preoxygenation: Pre-oxygenation with 100% oxygen Intubation Type: IV induction Ventilation: Mask ventilation without difficulty Laryngoscope Size: Miller and 2 Grade View: Grade I Tube type: Oral Tube size: 7.0 mm Number of attempts: 1 Airway Equipment and Method: Stylet Placement Confirmation: positive ETCO2,  ETT inserted through vocal cords under direct vision and breath sounds checked- equal and bilateral Secured at: 21 cm Tube secured with: Tape Dental Injury: Teeth and Oropharynx as per pre-operative assessment

## 2013-12-07 NOTE — Anesthesia Preprocedure Evaluation (Addendum)
Anesthesia Evaluation  Patient identified by MRN, date of birth, ID band Patient awake    Reviewed: Allergy & Precautions, H&P , NPO status , Patient's Chart, lab work & pertinent test results  History of Anesthesia Complications Negative for: history of anesthetic complications  Airway Mallampati: III TM Distance: >3 FB Neck ROM: Full    Dental  (+) Partial Upper   Pulmonary neg pulmonary ROS,  breath sounds clear to auscultation- rhonchi        Cardiovascular hypertension, Pt. 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   Dental advisory given  Plan Discussed with: CRNA and Surgeon  Anesthesia Plan Comments:         Anesthesia Quick Evaluation

## 2013-12-07 NOTE — Transfer of Care (Signed)
Immediate Anesthesia Transfer of Care Note  Patient: Ashley Bruce  Procedure(s) Performed: Procedure(s): LAPAROSCOPIC CHOLECYSTECTOMY WITH INTRAOPERATIVE CHOLANGIOGRAM (N/A)  Patient Location: PACU  Anesthesia Type:General  Level of Consciousness: awake, alert , oriented and patient cooperative  Airway & Oxygen Therapy: Patient Spontanous Breathing and Patient connected to nasal cannula oxygen  Post-op Assessment: Report given to PACU RN, Post -op Vital signs reviewed and stable and Patient moving all extremities  Post vital signs: Reviewed and stable  Complications: No apparent anesthesia complications

## 2013-12-07 NOTE — Progress Notes (Signed)
Lunch relief by T Citty RN 

## 2013-12-07 NOTE — Op Note (Signed)
Laparoscopic Cholecystectomy with IOC Procedure Note  Indications: This patient presents with symptomatic gallbladder disease and will undergo laparoscopic cholecystectomy.  Pre-operative Diagnosis: Calculus of gallbladder without mention of cholecystitis or obstruction  Post-operative Diagnosis: Same  Surgeon: Coralie Keens A   Assistants: 0  Anesthesia: General endotracheal anesthesia  ASA Class: 2  Procedure Details  The patient was seen again in the Holding Room. The risks, benefits, complications, treatment options, and expected outcomes were discussed with the patient. The possibilities of reaction to medication, pulmonary aspiration, perforation of viscus, bleeding, recurrent infection, finding a normal gallbladder, the need for additional procedures, failure to diagnose a condition, the possible need to convert to an open procedure, and creating a complication requiring transfusion or operation were discussed with the patient. The likelihood of improving the patient's symptoms with return to their baseline status is good.  The patient and/or family concurred with the proposed plan, giving informed consent. The site of surgery properly noted. The patient was taken to Operating Room, identified as Ashley Bruce and the procedure verified as Laparoscopic Cholecystectomy with Intraoperative Cholangiogram. A Time Out was held and the above information confirmed.  Prior to the induction of general anesthesia, antibiotic prophylaxis was administered. General endotracheal anesthesia was then administered and tolerated well. After the induction, the abdomen was prepped with Chloraprep and draped in the sterile fashion. The patient was positioned in the supine position.  Local anesthetic agent was injected into the skin near the umbilicus and an incision made. We dissected down to the abdominal fascia with blunt dissection.  The fascia was incised vertically and we entered the peritoneal  cavity bluntly.  A pursestring suture of 0-Vicryl was placed around the fascial opening.  The Hasson cannula was inserted and secured with the stay suture.  Pneumoperitoneum was then created with CO2 and tolerated well without any adverse changes in the patient's vital signs. An 5-mm port was placed in the subxiphoid position.  Two 5-mm ports were placed in the right upper quadrant. All skin incisions were infiltrated with a local anesthetic agent before making the incision and placing the trocars.   We positioned the patient in reverse Trendelenburg, tilted slightly to the patient's left.  The gallbladder was identified, the fundus grasped and retracted cephalad. Adhesions were lysed bluntly and with the electrocautery where indicated, taking care not to injure any adjacent organs or viscus. The infundibulum was grasped and retracted laterally, exposing the peritoneum overlying the triangle of Calot. This was then divided and exposed in a blunt fashion. A critical view of the cystic duct and cystic artery was obtained.  The cystic duct was clearly identified and bluntly dissected circumferentially. The cystic duct was ligated with a clip distally.   An incision was made in the cystic duct and the [facility] cholangiogram catheter introduced. The catheter was secured using a clip. A cholangiogram was then obtained which showed good visualization of the distal and proximal biliary tree with no sign of filling defects or obstruction.  Contrast flowed easily into the duodenum. The catheter was then removed.   The cystic duct was then ligated with clips and divided. The cystic artery was identified, dissected free, ligated with clips and divided as well.   The gallbladder was dissected from the liver bed in retrograde fashion with the electrocautery. The gallbladder was removed and placed in an Endocatch sac. The liver bed was irrigated and inspected. Hemostasis was achieved with the electrocautery. Copious irrigation  was utilized and was repeatedly aspirated until clear.  The  gallbladder and Endocatch sac were then removed through the umbilical port site.  The pursestring suture was used to close the umbilical fascia.    We again inspected the right upper quadrant for hemostasis.  Pneumoperitoneum was released as we removed the trocars.  4-0 Monocryl was used to close the skin.   Benzoin, steri-strips, and clean dressings were applied. The patient was then extubated and brought to the recovery room in stable condition. Instrument, sponge, and needle counts were correct at closure and at the conclusion of the case.   Findings: Cholecystitis with Cholelithiasis  Estimated Blood Loss: Minimal         Drains: 0         Specimens: Gallbladder           Complications: None; patient tolerated the procedure well.         Disposition: PACU - hemodynamically stable.         Condition: stable

## 2013-12-07 NOTE — Progress Notes (Signed)
Pt rates post op pain 8/10 when awakened, sleeps when left alone (interpreter assisted). Dr Ninfa Linden updated with pt's pain c/o. No new orders at pres.

## 2013-12-07 NOTE — Progress Notes (Signed)
When awakened, pt says pain 7-8/10, other wise, she sleeps when left alone. Dr Ermalene Postin updated-OK to move pt to Phase 2. Will cont to monitor.

## 2013-12-07 NOTE — Progress Notes (Signed)
Dr. Ninfa Linden phoned, informed of K+ of 2.7 after 4 runs of potassium (by I-stat)  Ordered to give an additional 4 runs.

## 2013-12-07 NOTE — Anesthesia Postprocedure Evaluation (Signed)
  Anesthesia Post-op Note  Patient: Ashley Bruce  Procedure(s) Performed: Procedure(s): LAPAROSCOPIC CHOLECYSTECTOMY WITH INTRAOPERATIVE CHOLANGIOGRAM (N/A)  Patient Location: PACU  Anesthesia Type:General  Level of Consciousness: awake  Airway and Oxygen Therapy: Patient Spontanous Breathing  Post-op Pain: moderate  Post-op Assessment: Post-op Vital signs reviewed, Patient's Cardiovascular Status Stable, Respiratory Function Stable, Patent Airway, No signs of Nausea or vomiting and Pain level controlled  Post-op Vital Signs: Reviewed and stable  Last Vitals:  Filed Vitals:   12/07/13 1330  BP: 149/82  Pulse: 66  Temp:   Resp: 14    Complications: No apparent anesthesia complications

## 2013-12-07 NOTE — H&P (Signed)
aquel Lando 12/03/2013 4:17 PM Location: [facility] Patient #: [id number] DOB: Apr 28, 1965 Widowed / Language: Spanish / Race: Undefined Female  History of Present Illness (Lallie Strahm A. Ninfa Linden MD; 12/03/2013 4:58 PM) Patient words: gallstones.  The patient is a 48 year old female who presents with symptomatic choledocholithiasis. This is a very pleasant female referred by the emergency department for systematic cholelithiasis. She presented there on September 27 with right-sided abdominal pain, nausea, and vomiting. Since then, she has continued to feel poorly. She did not speak [place]. Her daughter-in-law is here as an Astronomer. She feels like she may be slightly jaundiced. She has slight nausea but no further emesis. She is otherwise without complaints   Past Surgical History Marjean Donna, [place]; 12/03/2013 4:19 PM) No pertinent past surgical history  Diagnostic Studies History Marjean Donna, [place]; 12/03/2013 4:19 PM) Mammogram within last year Pap Smear 1-5 years ago  Allergies Marjean Donna, CMA; 12/03/2013 4:19 PM) No Known Drug Allergies10/15/2015  Medication History Davy Pique Bynum, CMA; 12/03/2013 4:19 PM) Hydrochlorothiazide (25MG  Tablet, Oral daily) Active. Tranexamic Acid (650MG  Tablet, Oral daily) Active.  Family History Marjean Donna, CMA; 12/03/2013 4:19 PM) Alcohol Abuse Brother, Father. Bleeding disorder Daughter, Sister. Hypertension Brother, Mother, Sister.  Pregnancy / Birth History Marjean Donna, [place]; 12/03/2013 4:19 PM) Age at menarche 67 years. Gravida 2 Irregular periods Maternal age 61-25 Para 2  Review of Systems ([place]; 12/03/2013 4:19 PM) General Present- Appetite Loss, Chills, Fatigue, Night Sweats and Weight Loss. Not Present- Fever and Weight Gain. Skin Not Present- Change in Wart/Mole, Dryness, Hives, Jaundice, New Lesions, Non-Healing Wounds, Rash and Ulcer. HEENT Present- Ringing in the Ears, Sore Throat and  Wears glasses/contact lenses. Not Present- Earache, Hearing Loss, Hoarseness, Nose Bleed, Oral Ulcers, Seasonal Allergies, Sinus Pain, Visual Disturbances and Yellow Eyes. Respiratory Not Present- Bloody sputum, Chronic Cough, Difficulty Breathing, Snoring and Wheezing. Breast Not Present- Breast Mass, Breast Pain, Nipple Discharge and Skin Changes. Cardiovascular Present- Leg Cramps. Not Present- Chest Pain, Difficulty Breathing Lying Down, Palpitations, Rapid Heart Rate, Shortness of Breath and Swelling of Extremities. Gastrointestinal Present- Abdominal Pain, Bloating, Excessive gas, Gets full quickly at meals, Indigestion and Nausea. Not Present- Bloody Stool, Change in Bowel Habits, Chronic diarrhea, Constipation, Difficulty Swallowing, Hemorrhoids, Rectal Pain and Vomiting. Female Genitourinary Present- Nocturia, Painful Urination, Pelvic Pain and Urgency. Not Present- Frequency. Musculoskeletal Present- Back Pain. Not Present- Joint Pain, Joint Stiffness, Muscle Pain, Muscle Weakness and Swelling of Extremities. Neurological Present- Headaches. Not Present- Decreased Memory, Fainting, Numbness, Seizures, Tingling, Tremor, Trouble walking and Weakness. Psychiatric Present- Change in Sleep Pattern and Frequent crying. Not Present- Anxiety, Bipolar, Depression and Fearful. Endocrine Not Present- Cold Intolerance, Excessive Hunger, Hair Changes, Heat Intolerance, Hot flashes and New Diabetes. Hematology Not Present- Easy Bruising, Excessive bleeding, Gland problems, HIV and Persistent Infections.   Vitals (Sonya Bynum CMA; 12/03/2013 4:20 PM) 12/03/2013 4:19 PM Weight: 123 lb Height: 59in Body Surface Area: 1.52 m Body Mass Index: 24.84 kg/m Temp.: 98.58F(Temporal)  Pulse: 72 (Regular)  BP: 118/70 (Sitting, Left Arm, Standard)    Physical Exam (Tommie Dejoseph A. Ninfa Linden MD; 12/03/2013 5:01 PM) General Note: She is comfortable in appearance   Head and Neck Note: External ears  and nose are normal, hearing is normal, oropharynx is clear, Neck is supple, trachea is midline   Eye Note: Pupils reactive bilaterally, it is difficult to tell but there may be very slight icterus   Chest and Lung Exam Note: Clear to auscultation bilaterally with normal respiratory effort  Cardiovascular Note: Regular rate and rhythm with no murmurs and no peripheral edema   Abdomen Note: Abdomen is soft and nondistended. There is very mild tenderness and guarding in the right mid-upper quadrant   Peripheral Vascular Note: Pulses intact throughout with no edema clubbing or cyanosis   Neurologic Note: Awake, alert, and oriented x3   Neuropsychiatric Note: Judgment and affect are normal   Musculoskeletal Note: No long bone abnormalities     Assessment & Plan (Jalie Eiland A. Ninfa Linden MD; 12/03/2013 5:02 PM) SYMPTOMATIC CHOLELITHIASIS (574.20  K80.20) Impression: I discussed this in detail with the patient through her daughter-in-law as an interpreter. Laparoscopic cholecystectomy with cholangiogram is urgently recommended. I discussed going ahead and putting her in the hospital but she refused and wants to wait for next week to do surgery. I discussed laparoscopic cholecystectomy with her in detail and gave her literature regarding the surgery. I discussed the risks which includes but is not limited to bleeding, infection, bile duct injury, bile leak, injury to other structures, the need to convert to an open procedure, etc. I also discussed the possibility of retained bile stone. I also discussed postoperative recovery. Surgery is scheduled urgently Current Plans  Started Percocet 5-325MG , 1 (one) Tablet every four hours, as needed, #30, 12/03/2013, No Refill. Started Zofran 4MG , 1 (one) Tablet every six hours, as needed, #20, 12/03/2013, No Refill.

## 2013-12-07 NOTE — Interval H&P Note (Signed)
History and Physical Interval Note: no change in H and P  12/07/2013 9:00 AM  Ashley Bruce  has presented today for surgery, with the diagnosis of Symptomatic Cholelithiasis  The various methods of treatment have been discussed with the patient and family. After consideration of risks, benefits and other options for treatment, the patient has consented to  Procedure(s): LAPAROSCOPIC CHOLECYSTECTOMY WITH INTRAOPERATIVE CHOLANGIOGRAM (N/A) as a surgical intervention .  The patient's history has been reviewed, patient examined, no change in status, stable for surgery.  I have reviewed the patient's chart and labs.  Questions were answered to the patient's satisfaction.     [name] A

## 2013-12-08 ENCOUNTER — Encounter (HOSPITAL_COMMUNITY): Payer: Self-pay | Admitting: General Practice

## 2013-12-08 DIAGNOSIS — K8010 Calculus of gallbladder with chronic cholecystitis without obstruction: Secondary | ICD-10-CM | POA: Diagnosis not present

## 2013-12-08 LAB — COMPREHENSIVE METABOLIC PANEL
ALK PHOS: 40 U/L (ref 39–117)
ALT: 26 U/L (ref 0–35)
AST: 41 U/L — ABNORMAL HIGH (ref 0–37)
Albumin: 3.2 g/dL — ABNORMAL LOW (ref 3.5–5.2)
Anion gap: 10 (ref 5–15)
BUN: 3 mg/dL — ABNORMAL LOW (ref 6–23)
CO2: 26 meq/L (ref 19–32)
CREATININE: 0.53 mg/dL (ref 0.50–1.10)
Calcium: 8.2 mg/dL — ABNORMAL LOW (ref 8.4–10.5)
Chloride: 106 mEq/L (ref 96–112)
GFR calc Af Amer: >90 mL/min (ref >90)
Glucose, Bld: 88 mg/dL (ref 70–99)
Potassium: 3.3 mEq/L — ABNORMAL LOW (ref 3.7–5.3)
Sodium: 142 mEq/L (ref 137–147)
Total Bilirubin: 0.5 mg/dL (ref 0.3–1.2)
Total Protein: 6.5 g/dL (ref 6.0–8.3)

## 2013-12-08 LAB — CBC
HCT: 26.3 % — ABNORMAL LOW (ref 36.0–46.0)
Hemoglobin: 7.9 g/dL — ABNORMAL LOW (ref 12.0–15.0)
MCH: 20.4 pg — AB (ref 26.0–34.0)
MCHC: 30 g/dL (ref 30.0–36.0)
MCV: 67.8 fL — AB (ref 78.0–100.0)
PLATELETS: 386 10*3/uL (ref 150–400)
RBC: 3.88 MIL/uL (ref 3.87–5.11)
RDW: 17.4 % — AB (ref 11.5–15.5)
WBC: 7.2 10*3/uL (ref 4.0–10.5)

## 2013-12-08 MED ORDER — POTASSIUM CHLORIDE CRYS ER 20 MEQ PO TBCR
20.0000 meq | EXTENDED_RELEASE_TABLET | ORAL | Status: AC
  Administered 2013-12-08: 20 meq via ORAL
  Filled 2013-12-08: qty 1

## 2013-12-08 MED ORDER — INFLUENZA VAC SPLIT QUAD 0.5 ML IM SUSY
0.5000 mL | PREFILLED_SYRINGE | INTRAMUSCULAR | Status: AC
  Administered 2013-12-09: 0.5 mL via INTRAMUSCULAR
  Filled 2013-12-08: qty 0.5

## 2013-12-08 MED ORDER — POTASSIUM CHLORIDE CRYS ER 20 MEQ PO TBCR
30.0000 meq | EXTENDED_RELEASE_TABLET | Freq: Once | ORAL | Status: AC
  Administered 2013-12-08: 30 meq via ORAL
  Filled 2013-12-08 (×4): qty 1

## 2013-12-08 NOTE — Progress Notes (Signed)
I have seen and examined the patient and agree with the assessment and plans.  Charlies Rayburn A. Adriene Padula  MD, FACS  

## 2013-12-08 NOTE — Progress Notes (Signed)
Pt has been ambulating out in the hall today and has tolerated walking well.  Will continue to monitor. Syliva Overman

## 2013-12-08 NOTE — Progress Notes (Signed)
1 Day Post-Op  Subjective: She is pretty sore and has not been up much, full liquids for breakfast.  IV still going.    Objective: Vital signs in last 24 hours: Temp:  [97.7 F (36.5 C)-98.7 F (37.1 C)] 98.1 F (36.7 C) (10/20 0510) Pulse Rate:  [59-89] 71 (10/20 0510) Resp:  [10-21] 17 (10/20 0510) BP: (111-153)/(67-85) 111/69 mmHg (10/20 0510) SpO2:  [96 %-100 %] 96 % (10/20 0510)   Full liquid diet Afebrile, VSS K+ 3.3 Anemic H/H is down to 7.9/26 this AM IOC is negative Intake/Output from previous day: 10/19 0701 - 10/20 0700 In: 1176.7 [I.V.:876.7; IV Piggyback:300] Out: -  Intake/Output this shift:    General appearance: alert, cooperative and no distress Resp: clear to auscultation bilaterally GI: soft sore, +BS, port sites all look good.  Lab Results:   Recent Labs  12/07/13 0919 12/07/13 1556 12/08/13 0505  WBC 6.3  --  7.2  HGB 8.6* 9.9* 7.9*  HCT 28.7* 29.0* 26.3*  PLT 435*  --  386    BMET  Recent Labs  12/07/13 0919 12/07/13 1556 12/08/13 0505  NA 141 140 142  K 2.8* 2.7* 3.3*  CL 102  --  106  CO2 26  --  26  GLUCOSE 87 112* 88  BUN 6  --  3*  CREATININE 0.54  --  0.53  CALCIUM 8.9  --  8.2*   PT/INR No results found for this basename: LABPROT, INR,  in the last 72 hours   Recent Labs Lab 12/08/13 0505  AST 41*  ALT 26  ALKPHOS 40  BILITOT 0.5  PROT 6.5  ALBUMIN 3.2*     Lipase  No results found for this basename: lipase     Studies/Results: Dg Chest 2 View  12/07/2013   CLINICAL DATA:  Preoperative examination. Patient for cholecystectomy.  EXAM: CHEST  2 VIEW  COMPARISON:  PA and lateral chest 05/09/2004.  FINDINGS: Heart size and mediastinal contours are within normal limits. Both lungs are clear. Visualized skeletal structures are unremarkable.  IMPRESSION: Negative exam.   Electronically Signed   By: Inge Rise M.D.   On: 12/07/2013 09:15   Dg Cholangiogram Operative  12/07/2013   CLINICAL DATA:   Laparoscopic cholecystectomy. Intraoperative cholangiogram.  EXAM: INTRAOPERATIVE CHOLANGIOGRAM  FLUOROSCOPY TIME:  11 seconds  COMPARISON:  CT abdomen pelvis - 11/15/2013  FINDINGS: Intraoperative angiographic images of the right upper abdominal quadrant during laparoscopic cholecystectomy are provided for review.  Surgical clips overlie the expected location of the gallbladder fossa.  Contrast injection demonstrates selective cannulation of the central aspect of the cystic duct.  There is passage of contrast through the central aspect of the cystic duct with filling of a non dilated common bile duct. There is passage of contrast though the CBD and into the descending portion of the duodenum.  There is minimal reflux of injected contrast into the common hepatic duct and central aspect of the non dilated intrahepatic biliary system.  There are no discrete filling defects within the opacified portions of the biliary system to suggest the presence of choledocholithiasis.  IMPRESSION: No evidence of choledocholithiasis.   Electronically Signed   By: Sandi Mariscal M.D.   On: 12/07/2013 14:51    Medications: . enoxaparin (LOVENOX) injection  40 mg Subcutaneous Q24H  . [START ON 12/09/2013] Influenza vac split quadrivalent PF  0.5 mL Intramuscular Tomorrow-1000    Assessment/Plan Calculus of gallbladder without mention of cholecystitis or obstruction  Laparoscopic Cholecystectomy with  IOC, 12/07/13, Dr. Coralie Keens  Hypokalemia  i will replace this.   Plan:  Mobilize, advance diet and if OK home after lunch.     LOS: 1 day    Ashley Bruce 12/08/2013

## 2013-12-09 DIAGNOSIS — K8010 Calculus of gallbladder with chronic cholecystitis without obstruction: Secondary | ICD-10-CM | POA: Diagnosis not present

## 2013-12-09 NOTE — Progress Notes (Signed)
Patient discharged to home with instructions. 

## 2013-12-09 NOTE — Discharge Summary (Signed)
Patient IDJaiyah Bruce MRN: [id number] DOB/AGE: 11/02/1965 48 y.o.  Admit date: 12/07/2013 Discharge date: 12/09/2013  Procedures: lap chole with IOC  Consults: None  Reason for Admission: The patient is a 48 year old female who presents with symptomatic choledocholithiasis. This is a very pleasant female referred by the emergency department for systematic cholelithiasis. She presented there on September 27 with right-sided abdominal pain, nausea, and vomiting. Since then, she has continued to feel poorly. She did not speak [place]. Her daughter-in-law is here as an Astronomer. She feels like she may be slightly jaundiced. She has slight nausea but no further emesis. She is otherwise without complaints  Admission Diagnoses:  1. Symptomatic cholelithiasis  Hospital Course: The patient was admitted and taken to the OR where she underwent a lap chole with IOC.  She tolerated it well, but her pain was not well controlled on POD 1.  She was kept for pain control.  She was eating solid food well though.  On POD 2, her pain was better controlled.  She was stable for dc home at this time.  PE: Abd: soft, appropriately tender, +BS, ND, incisions c/d/i  Discharge Diagnoses:  Active Problems:   S/P laparoscopic cholecystectomy   Discharge Medications:   Medication List    STOP taking these medications       HYDROcodone-acetaminophen 5-325 MG per tablet  Commonly known as:  NORCO/VICODIN      TAKE these medications       hydrochlorothiazide 25 MG tablet  Commonly known as:  HYDRODIURIL  Take 25 mg by mouth daily.     oxyCODONE 5 MG immediate release tablet  Commonly known as:  Oxy IR/ROXICODONE  Take 1 tablet (5 mg total) by mouth every 4 (four) hours as needed for severe pain.     potassium chloride SA 20 MEQ tablet  Commonly known as:  K-DUR,KLOR-CON  Take 1 tablet (20 mEq total) by mouth daily.     tranexamic acid 650 MG Tabs tablet  Commonly known as:  LYSTEDA  Take  1,300 mg by mouth 3 (three) times daily. Only takes for 5 days of her menstrual cycle each month.        Discharge Instructions:     Follow-up Information   Follow up with [facility] A, MD. Schedule an appointment as soon as possible for a visit in 3 weeks.   Specialty:  General Surgery   Contact information:   [address] [place] [address] [phone]       Signed: Henreitta Cea 12/09/2013, 9:04 AM

## 2013-12-09 NOTE — Discharge Summary (Signed)
I have seen and examined the patient and agree with the assessment and plans.  Ermal Brzozowski A. Sharhonda Atwood  MD, FACS  

## 2013-12-09 NOTE — Discharge Instructions (Signed)
[facility] ______[facility] °LAPAROSCOPIC SURGERY: POST OP INSTRUCTIONS °Always review your discharge instruction sheet given to you by the facility where your surgery was performed. °IF YOU HAVE DISABILITY OR FAMILY LEAVE FORMS, YOU MUST BRING THEM TO THE OFFICE FOR PROCESSING.   °DO NOT GIVE THEM TO YOUR DOCTOR. ° °1. A prescription for pain medication may be given to you upon discharge.  Take your pain medication as prescribed, if needed.  If narcotic pain medicine is not needed, then you may take acetaminophen (Tylenol) or ibuprofen (Advil) as needed. °2. Take your usually prescribed medications unless otherwise directed. °3. If you need a refill on your pain medication, please contact your pharmacy.  They will contact our office to request authorization. Prescriptions will not be filled after 5pm or on week-ends. °4. You should follow a light diet the first few days after arrival home, such as soup and crackers, etc.  Be sure to include lots of fluids daily. °5. Most patients will experience some swelling and bruising in the area of the incisions.  Ice packs will help.  Swelling and bruising can take several days to resolve.  °6. It is common to experience some constipation if taking pain medication after surgery.  Increasing fluid intake and taking a stool softener (such as Colace) will usually help or prevent this problem from occurring.  A mild laxative (Milk of Magnesia or Miralax) should be taken according to package instructions if there are no bowel movements after 48 hours. °7. Unless discharge instructions indicate otherwise, you may remove your bandages 24-48 hours after surgery, and you may shower at that time.  You may have steri-strips (small skin tapes) in place directly over the incision.  These strips should be left on the skin for 7-10 days.  If your surgeon used skin glue on the incision, you may shower in 24 hours.  The glue will flake off over the next 2-3 weeks.  Any sutures or  staples will be removed at the office during your follow-up visit. °8. ACTIVITIES:  You may resume regular (light) daily activities beginning the next day--such as daily self-care, walking, climbing stairs--gradually increasing activities as tolerated.  You may have sexual intercourse when it is comfortable.  Refrain from any heavy lifting or straining until approved by your doctor. °a. You may drive when you are no longer taking prescription pain medication, you can comfortably wear a seatbelt, and you can safely maneuver your car and apply brakes. °b. RETURN TO WORK:  __________________________________________________________ °9. You should see your doctor in the office for a follow-up appointment approximately 2-3 weeks after your surgery.  Make sure that you call for this appointment within a day or two after you arrive home to insure a convenient appointment time. °10. OTHER INSTRUCTIONS: __________________________________________________________________________________________________________________________ __________________________________________________________________________________________________________________________ °WHEN TO CALL YOUR DOCTOR: °1. Fever over 101.0 °2. Inability to urinate °3. Continued bleeding from incision. °4. Increased pain, redness, or drainage from the incision. °5. Increasing abdominal pain ° °The clinic staff is available to answer your questions during regular business hours.  Please don’t hesitate to call and ask to speak to one of the nurses for clinical concerns.  If you have a medical emergency, go to the nearest emergency room or call 911.  A surgeon from [facility] is always on call at the hospital. °[address], [address], [place], [place]  [address] ? [address] [address], [place],    [address] °[phone] ? [phone] ? FAX [phone] °Web site:   [email or web address]  What to eat:  For your first meals, you should eat  lightly; only small meals initially.  If you do not have nausea, you may eat larger meals.  Avoid spicy, greasy and heavy food.    General Anesthesia, Adult, Care After  Refer to this sheet in the next few weeks. These instructions provide you with information on caring for yourself after your procedure. Your health care provider may also give you more specific instructions. Your treatment has been planned according to current medical practices, but problems sometimes occur. Call your health care provider if you have any problems or questions after your procedure.  WHAT TO EXPECT AFTER THE PROCEDURE  After the procedure, it is typical to experience:  Sleepiness.  Nausea and vomiting. HOME CARE INSTRUCTIONS  For the first 24 hours after general anesthesia:  Have a responsible person with you.  Do not drive a car. If you are alone, do not take public transportation.  Do not drink alcohol.  Do not take medicine that has not been prescribed by your health care provider.  Do not sign important papers or make important decisions.  You may resume a normal diet and activities as directed by your health care provider.  Change bandages (dressings) as directed.  If you have questions or problems that seem related to general anesthesia, call the hospital and ask for the anesthetist or anesthesiologist on call. SEEK MEDICAL CARE IF:  You have nausea and vomiting that continue the day after anesthesia.  You develop a rash. SEEK IMMEDIATE MEDICAL CARE IF:  You have difficulty breathing.  You have chest pain.  You have any allergic problems. Document Released: 05/14/2000 Document Revised: 10/08/2012 Document Reviewed: 08/21/2012  [facility] Patient Information 2014 [place], [place].   CIRUGIA LAPAROSCOPICA: INSTRUCCIONES DE POST OPERATORIO.  Revise siempre los documentos que le entreguen en el lugar donde se ha hecho la [place].  SI USTED NECESITA DOCUMENTOS DE INCAPACIDAD (DISABLE) O DE PERMISO FAMILAR  (FAMILY LEAVE) NECESITA TRAERLOS A LA OFICINA PARA QUE SEAN PROCESADOS. NO  SE LOS DE A SU DOCTOR. 11. A su alta del hospital se le dara una receta para Financial controller. Tomela como ha sido recetada, si la necesita. Si no la necesita puede tomar, Acetaminofen (Tylenol) o Ibuprofen (Advil) para aliviar dolor moderado. 12. Continue tomando el resto de sus medicinas. 13. Si necesita rellenar la receta, llame a la farmacia. ellos contactan a nuestra oficina pidiendo autorizacion. Este tipo de receta no pueden ser [employer] de las  5pm o [employer] fines de [place]. 14. Con relacion a la dieta: debe ser [employer] primeros dias despues que llege a la casa. Ejemplo: sopas y galleticas. Tome bastante liquido esos dias. 61. La mayoria de los pacientes padecen de inflamacion y cambio de coloracion de la piel alrededor de las incisiones. esto toma dias en resolver.  pnerse una bolsa de hielo en el area affectada ayuda..  58. Es comun tambien tener un poco de estrenimiento si esta tomado medicinas para Conservation officer, historic buildings. incremente la cantidad de liquidos a tomar y Doctor, hospital (Colace) esto previene el problema. Si ya tiene estrenimiento, es Software engineer no ha defecado en 48 horas, puede tomar un laxativo (Milk of Magnesia or Miralax) uselo como el paquete le explica. 70.  A menos que se le diga algo diferente. Remueva el bendaje a las 24-48 horas despues dela [place]. y puede banarse en la ducha sin ningun problema. usted puede tener steri-strips (pequenas curitas transparentes en la piel  puesta encima de la incision)  Estas banditas strips should be left on the skin for 7-10 days.   Si su cirujano puso pegamento encima de la incision usted puede banarse bajo la ducha en 24 horas. Este pegamento empezara a caerse en las proximas 2-3 semanas. Si le pusieron suturas o presillas (grapos) estos seran quitados en su proxima cita en la oficina. Marland Kitchen a. ACTIVIDADES:  Puede hacer actividad ligera.  Como caminar , subir escaleras y  poco a poco irlas incrementando tanto como las [place]. Puede tener relaciones sexuales cuando sea comfortable. No carge objetos pesados o haga esfuerzos que no sean aprovados por su doctor. b. Puede manejar en cuanto no esta tomando medicamentos fuertes (narcoticos) para Conservation officer, historic buildings, pueda abrochar confortablemente el cinturon de seguridad, y pueda Psychologist, counselling y usar los pedales de su vehiculo con seguridad. c. [place] A TRABAJAR  18. Debe ver a su doctor para una cita de seguimiento en 2-3 semanas despues de la [place].  60. OTRAS ISNSTRUCCIONES:___________________________________________________________________________________ Angelina Pih A SU MEDICO: 6. FIEBRE mayor de  101.0 7. No produccion de [place]. 8. Sangramiento continue de la herida 9. Incremento de dolor, enrojecimientio o drenaje de la herida (incision) 10. Incremento de dolor abdominal.  The clinic staff is available to answer your questions during regular business hours.  Please dont hesitate to call and ask to speak to one of the nurses for clinical concerns.  If you have a medical emergency, go to the nearest emergency room or call 911.  A surgeon from [facility] is always on call at the hospital. [address], [place], [place], [place]  [address] ? [address], [place], [place]   [address] [phone] ? [phone] ? FAX [phone] Web site: [email or web address]

## 2014-01-01 ENCOUNTER — Emergency Department (HOSPITAL_COMMUNITY): Payer: 59

## 2014-01-01 ENCOUNTER — Encounter (HOSPITAL_COMMUNITY): Payer: Self-pay | Admitting: Family Medicine

## 2014-01-01 ENCOUNTER — Emergency Department (HOSPITAL_COMMUNITY)
Admission: EM | Admit: 2014-01-01 | Discharge: 2014-01-02 | Disposition: A | Discharge status: Home / Self Care | Payer: 59 | Attending: Emergency Medicine | Admitting: Emergency Medicine

## 2014-01-01 DIAGNOSIS — I10 Essential (primary) hypertension: Secondary | ICD-10-CM | POA: Diagnosis not present

## 2014-01-01 DIAGNOSIS — R110 Nausea: Secondary | ICD-10-CM | POA: Insufficient documentation

## 2014-01-01 DIAGNOSIS — R10813 Right lower quadrant abdominal tenderness: Secondary | ICD-10-CM | POA: Insufficient documentation

## 2014-01-01 DIAGNOSIS — Z79899 Other long term (current) drug therapy: Secondary | ICD-10-CM | POA: Diagnosis not present

## 2014-01-01 DIAGNOSIS — Z8742 Personal history of other diseases of the female genital tract: Secondary | ICD-10-CM | POA: Insufficient documentation

## 2014-01-01 DIAGNOSIS — R109 Unspecified abdominal pain: Secondary | ICD-10-CM | POA: Diagnosis not present

## 2014-01-01 DIAGNOSIS — N858 Other specified noninflammatory disorders of uterus: Secondary | ICD-10-CM | POA: Diagnosis not present

## 2014-01-01 DIAGNOSIS — N9489 Other specified conditions associated with female genital organs and menstrual cycle: Secondary | ICD-10-CM

## 2014-01-01 LAB — URINE MICROSCOPIC-ADD ON

## 2014-01-01 LAB — COMPREHENSIVE METABOLIC PANEL
ALT: 7 U/L (ref 0–35)
AST: 13 U/L (ref 0–37)
Albumin: 3.4 g/dL — ABNORMAL LOW (ref 3.5–5.2)
Alkaline Phosphatase: 70 U/L (ref 39–117)
Anion gap: 12 (ref 5–15)
BUN: 11 mg/dL (ref 6–23)
CO2: 23 mEq/L (ref 19–32)
Calcium: 8.6 mg/dL (ref 8.4–10.5)
Chloride: 105 mEq/L (ref 96–112)
Creatinine, Ser: 0.53 mg/dL (ref 0.50–1.10)
GFR calc Af Amer: >90 mL/min (ref >90)
GFR calc non Af Amer: >90 mL/min (ref >90)
Glucose, Bld: 92 mg/dL (ref 70–99)
Potassium: 3.6 mEq/L — ABNORMAL LOW (ref 3.7–5.3)
Sodium: 140 mEq/L (ref 137–147)
Total Bilirubin: 0.3 mg/dL (ref 0.3–1.2)
Total Protein: 7.2 g/dL (ref 6.0–8.3)

## 2014-01-01 LAB — CBC WITH DIFFERENTIAL/PLATELET
BASOS PCT: 1 % (ref 0–1)
Basophils Absolute: 0.1 10*3/uL (ref 0.0–0.1)
EOS PCT: 3 % (ref 0–5)
Eosinophils Absolute: 0.2 10*3/uL (ref 0.0–0.7)
HCT: 26.3 % — ABNORMAL LOW (ref 36.0–46.0)
HEMOGLOBIN: 7.8 g/dL — AB (ref 12.0–15.0)
LYMPHS PCT: 34 % (ref 12–46)
Lymphs Abs: 2.5 10*3/uL (ref 0.7–4.0)
MCH: 19.7 pg — ABNORMAL LOW (ref 26.0–34.0)
MCHC: 29.7 g/dL — ABNORMAL LOW (ref 30.0–36.0)
MCV: 66.6 fL — ABNORMAL LOW (ref 78.0–100.0)
Monocytes Absolute: 0.7 10*3/uL (ref 0.1–1.0)
Monocytes Relative: 10 % (ref 3–12)
Neutro Abs: 3.8 10*3/uL (ref 1.7–7.7)
Neutrophils Relative %: 52 % (ref 43–77)
Platelets: 469 10*3/uL — ABNORMAL HIGH (ref 150–400)
RBC: 3.95 MIL/uL (ref 3.87–5.11)
RDW: 17.6 % — ABNORMAL HIGH (ref 11.5–15.5)
WBC: 7.3 10*3/uL (ref 4.0–10.5)

## 2014-01-01 LAB — URINALYSIS, ROUTINE W REFLEX MICROSCOPIC
Bilirubin Urine: NEGATIVE
GLUCOSE, UA: NEGATIVE mg/dL
Ketones, ur: NEGATIVE mg/dL
Leukocytes, UA: NEGATIVE
Nitrite: NEGATIVE
PROTEIN: NEGATIVE mg/dL
Specific Gravity, Urine: 1.017 (ref 1.005–1.030)
Urobilinogen, UA: 0.2 mg/dL (ref 0.0–1.0)
pH: 7.5 (ref 5.0–8.0)

## 2014-01-01 LAB — POC URINE PREG, ED: Preg Test, Ur: NEGATIVE

## 2014-01-01 LAB — LIPASE, BLOOD: Lipase: 71 U/L — ABNORMAL HIGH (ref 11–59)

## 2014-01-01 MED ORDER — IOHEXOL 300 MG/ML  SOLN
100.0000 mL | Freq: Once | INTRAMUSCULAR | Status: AC | PRN
  Administered 2014-01-01: 100 mL via INTRAVENOUS

## 2014-01-01 MED ORDER — ONDANSETRON HCL 4 MG/2ML IJ SOLN
4.0000 mg | Freq: Once | INTRAMUSCULAR | Status: AC
  Administered 2014-01-01: 4 mg via INTRAVENOUS
  Filled 2014-01-01: qty 2

## 2014-01-01 MED ORDER — SODIUM CHLORIDE 0.9 % IV BOLUS (SEPSIS)
1000.0000 mL | Freq: Once | INTRAVENOUS | Status: AC
  Administered 2014-01-01: 1000 mL via INTRAVENOUS

## 2014-01-01 MED ORDER — MORPHINE SULFATE 4 MG/ML IJ SOLN
4.0000 mg | Freq: Once | INTRAMUSCULAR | Status: AC
  Administered 2014-01-01: 4 mg via INTRAVENOUS
  Filled 2014-01-01: qty 1

## 2014-01-01 MED ORDER — IOHEXOL 300 MG/ML  SOLN
25.0000 mL | Freq: Once | INTRAMUSCULAR | Status: AC | PRN
  Administered 2014-01-01: 25 mL via ORAL

## 2014-01-01 NOTE — ED Notes (Signed)
Informed CT that pt was done drinking her contrast

## 2014-01-01 NOTE — ED Notes (Signed)
Per pt family pt having RUQ pain. sts gallbladder surgery  3 weeks ago. sts some nausea. sts area hurts when her clothes rub on the area.

## 2014-01-01 NOTE — ED Provider Notes (Signed)
CSN: [id number]     Arrival date & time 01/01/14  1752 History   First MD Initiated Contact with Patient 01/01/14 2033     Chief Complaint  Patient presents with  . Abdominal Pain     (Consider location/radiation/quality/duration/timing/severity/associated sxs/prior Treatment) HPI  48 year old female presents with 3 days of right-sided abdominal pain. It is intermittent though it is present for most of the day. Does not necessarily worsen with eating. Has been nauseous without vomiting. 3 weeks ago had a cholecystectomy for symptomatic gallstones. Patient states the pain is similar to what she had with her gallbladder. Had a fever of 102 yesterday. Patient has not had any urinary symptoms. No constipation or diarrhea. Pain is a 7 out of 10.  Past Medical History  Diagnosis Date  . Hypertension   . Fibroids    Past Surgical History  Procedure Laterality Date  . Cholecystectomy  12/07/2013  . Cesarean section    . Cholecystectomy N/A 12/07/2013    Procedure: LAPAROSCOPIC CHOLECYSTECTOMY WITH INTRAOPERATIVE CHOLANGIOGRAM;  Surgeon: Coralie Keens, MD;  Location: [facility];  Service: General;  Laterality: N/A;   History reviewed. No pertinent family history. History  Substance Use Topics  . Smoking status: Never Smoker   . Smokeless tobacco: Never Used  . Alcohol Use: No   OB History    No data available     Review of Systems  Constitutional: Positive for fever.  Gastrointestinal: Positive for nausea and abdominal pain. Negative for vomiting, diarrhea and constipation.  Genitourinary: Negative for dysuria.  All other systems reviewed and are negative.     Allergies  Tylenol  Home Medications   Prior to Admission medications   Medication Sig Start Date End Date Taking? Authorizing Provider  hydrochlorothiazide (HYDRODIURIL) 25 MG tablet Take 25 mg by mouth daily.    Historical Provider, MD  oxyCODONE (OXY IR/ROXICODONE) 5 MG immediate release tablet Take 1 tablet (5 mg  total) by mouth every 4 (four) hours as needed for severe pain. 12/07/13   Coralie Keens, MD  potassium chloride SA (K-DUR,KLOR-CON) 20 MEQ tablet Take 1 tablet (20 mEq total) by mouth daily. 11/16/13   Richarda Blade, MD  tranexamic acid (LYSTEDA) 650 MG TABS Take 1,300 mg by mouth 3 (three) times daily. Only takes for 5 days of her menstrual cycle each month.    Historical Provider, MD   BP 136/74 mmHg  Pulse 81  Temp(Src) 97.4 F (36.3 C)  Resp 16  Wt 127 lb 9 oz (57.862 kg)  SpO2 100%  LMP 12/29/2013 Physical Exam  Constitutional: She is oriented to person, place, and time. She appears well-developed and well-nourished. No distress.  HENT:  Head: Normocephalic and atraumatic.  Right Ear: External ear normal.  Left Ear: External ear normal.  Nose: Nose normal.  Eyes: Right eye exhibits no discharge. Left eye exhibits no discharge.  Cardiovascular: Normal rate, regular rhythm and normal heart sounds.   Pulmonary/Chest: Effort normal and breath sounds normal.  Abdominal: Soft. There is tenderness in the right lower quadrant.  Laparoscopic wounds appear well, no focal erythema or drainage  Neurological: She is alert and oriented to person, place, and time.  Skin: Skin is warm and dry.  Nursing note and vitals reviewed.   ED Course  Procedures (including critical care time) Labs Review Labs Reviewed  CBC WITH DIFFERENTIAL - Abnormal; Notable for the following:    Hemoglobin 7.8 (*)    HCT 26.3 (*)    MCV 66.6 (*)  MCH 19.7 (*)    MCHC 29.7 (*)    RDW 17.6 (*)    Platelets 469 (*)    All other components within normal limits  COMPREHENSIVE METABOLIC PANEL - Abnormal; Notable for the following:    Potassium 3.6 (*)    Albumin 3.4 (*)    All other components within normal limits  LIPASE, BLOOD - Abnormal; Notable for the following:    Lipase 71 (*)    All other components within normal limits  URINALYSIS, ROUTINE W REFLEX MICROSCOPIC - Abnormal; Notable for the  following:    APPearance TURBID (*)    Hgb urine dipstick LARGE (*)    All other components within normal limits  URINE MICROSCOPIC-ADD ON - Abnormal; Notable for the following:    Squamous Epithelial / LPF FEW (*)    Bacteria, UA MANY (*)    All other components within normal limits  POC URINE PREG, ED    Imaging Review Ct Abdomen Pelvis W Contrast  01/01/2014   CLINICAL DATA:  Initial evaluation for right upper quadrant pain, underwent gallbladder resection 3 weeks ago  EXAM: CT ABDOMEN AND PELVIS WITH CONTRAST  TECHNIQUE: Multidetector CT imaging of the abdomen and pelvis was performed using the standard protocol following bolus administration of intravenous contrast.  CONTRAST:  135mL OMNIPAQUE IOHEXOL 300 MG/ML  SOLN  COMPARISON:  11/15/2013, CT and ultrasound  FINDINGS: Visualized lung bases clear.  No acute musculoskeletal findings.  Status post cholecystectomy. Mild intrahepatic biliary dilatation. Common bile duct 12 mm. No calculi identified within the common bile duct.  Liver otherwise normal. Spleen and pancreas normal. Kidneys and adrenal glands normal.  Abdominal aorta normal. Stomach, small bowel, and large bowel normal. Appendix normal.  No ascites. Bladder normal. Ovaries normal. There is an intrauterine enhancing mass measuring about 4.4 cm located within the endometrium. This was seen previously. It appears mildly larger.  IMPRESSION: 1. No acute abnormality. Mild biliary dilatation likely related to status post cholecystectomy. 2. Endometrial polypoid lesion. Differential considerations include polyp or carcinoma. Surgical consultation suggested. It appears mildly larger when compared to prior study.   Electronically Signed   By: Skipper Cliche M.D.   On: 01/01/2014 22:55     EKG Interpretation None      MDM   Final diagnoses:  Abdominal pain    Patient's pain controlled in ED. No obvious source for her pain. No dysuria. No fevers here. WBC normal, anemia at baseline  post-op. No signs of abscess, and has normal appendix on CT. Has adequate amount of pain meds at home. Will recommend she f/u with her GYN given endometrial findings and return to ED if pain returns or worsens.    Ephraim Hamburger, MD 01/02/14 [phone]

## 2014-01-01 NOTE — Discharge Instructions (Signed)
Dolor abdominal (Abdominal Pain) El dolor puede tener muchas causas. Normalmente la causa del dolor abdominal no es una enfermedad y Teacher, English as a foreign language sin Clinical research associate. Frecuentemente puede controlarse y tratarse en casa. Su mdico le Chartered certified accountant examen fsico y posiblemente solicite anlisis de sangre y radiografas para ayudar a Teacher, adult education la gravedad de su dolor. Sin embargo, en [employer], debe transcurrir ms tiempo antes de que se pueda Pension scheme manager una causa evidente del dolor. Antes de llegar a ese punto, es posible que su mdico no sepa si necesita ms pruebas o un tratamiento ms profundo. INSTRUCCIONES PARA EL CUIDADO EN EL HOGAR  Est atento al dolor para ver si hay cambios. Las siguientes indicaciones ayudarn a Chief Strategy Officer que pueda sentir:  [place] solo medicamentos de venta libre o recetados, segn las indicaciones del mdico.  No tome laxantes a menos que se lo haya indicado su mdico.  Pruebe con Ardelia Mems dieta lquida absoluta (caldo, t o agua) segn se lo indique su mdico. Introduzca gradualmente una dieta normal, segn su tolerancia. SOLICITE ATENCIN MDICA SI:  Tiene dolor abdominal sin explicacin.  Tiene dolor abdominal relacionado con nuseas o diarrea.  Tiene dolor cuando orina o defeca.  Experimenta dolor abdominal que lo despierta de noche.  Tiene dolor abdominal que empeora o mejora cuando come alimentos.  Tiene dolor abdominal que empeora cuando come alimentos grasosos.  Tiene fiebre. SOLICITE ATENCIN MDICA DE INMEDIATO SI:   El dolor no desaparece en un plazo mximo de 2horas.  No deja de (vomitar).  El Social research officer, government se siente solo en partes del abdomen, como el lado derecho o la parte inferior izquierda del abdomen.  Evaca materia fecal sanguinolenta o negra, de aspecto alquitranado. ASEGRESE DE QUE:  Comprende estas instrucciones.  Controlar su afeccin.  Recibir ayuda de inmediato si no mejora o si empeora. Document Released: 02/05/2005  Document Revised: 02/10/2013 [facility] Patient Information 2015 [place]. This information is not intended to replace advice given to you by your health care provider. Make sure you discuss any questions you have with your health care provider.   Fibroma uterino (Uterine Fibroid) Un fibroma uterino es un crecimiento (tumor) dentro del tero. Este tipo de tumor no es Radio broadcast assistant y no se extiende fuera del tero. Podr tener uno o varios fibromas. Los fibromas pueden variar en tamao, peso y TEFL teacher en que se desarrollan dentro del tero. Algunos pueden llegar a ser bastante grandes. La mayora de los fibromas no necesitan tratamiento mdico, pero algunos pueden causar dolor o sangrado abundante durante los perodos y [place]. CAUSAS  Un fibroma es el resultado del desarrollo continuo de una nica clula uterina que sigue creciendo (no regulada) que es diferente al resto de las clulas del cuerpo humano. La mayora de las clulas tiene un mecanismo de control que evita que se reproduzcan de Research officer, trade union.  SIGNOS Y SNTOMAS   Hemorragias.  Dolor y sensacin de presin en la pelvis.  Problemas en la vejiga debido al tamao del fibroma.  Infertilidad y abortos espontneos, segn el tamao y la ubicacin del fibroma. DIAGNSTICO  Los fibromas uterinos se diagnostican con un examen fsico. El mdico puede palpar los tumores abultados al realizar el examen de la pelvis. Una ecografa puede indicarse para tener informacin del tamao, la ubicacin y el nmero de tumores.  TRATAMIENTO   El mdico puede considerar que es conveniente esperar y Barrister's clerk. Esto incluye el control del fibroma por parte del mdico para observar si crece o disminuye su tamao.  Podr indicarle un tratamiento hormonal o el uso de un dispositivo intrauterino (DIU).  En algunos casos es necesaria la ciruga para extirpar el fibroma (miomectoma) o el tero (histerectoma). Esto depender de su  situacin. Cuando una mujer desea quedar embarazada y los fibromas interfieren en su fertilidad, el mdico puede recomendar la extirpacin del fibroma.  INSTRUCCIONES PARA EL CUIDADO EN EL HOGAR  Los cuidados en el hogar dependen del tratamiento que haya recibido. En general:   [place] con todas las visitas de control, segn le indique su mdico.  Tome slo medicamentos de venta libre o recetados, segn las indicaciones del mdico. Si le recetaron un tratamiento hormonal, tome los medicamentos hormonales como le indicaron. No tome aspirina. Puede ocasionar hemorragias.  Consulte al mdico si debe tomar pldoras de hierro.  Si sus perodos son molestos pero no tan abundantes, acustese con los pies ligeramente elevados por encima del nivel del corazn. Coloque compresas fras en la zona inferior del abdomen.  Si sus perodos son muy abundantes, anote el nmero de compresas o tampones que [place] cada mes. Lleve esta informacin a su consulta mdica.  Incluya vegetales verdes en su dieta. SOLICITE ATENCIN MDICA DE INMEDIATO SI:  Siente dolor o clicos en la pelvis y no puede controlarlos con los medicamentos.  El dolor en la pelvis aumenta de manera repentina.  Aumenta el sangrado entre los perodos o [employer].  Si tiene perodos muy abundantes y debe cambiar un tampn o una toalla higinica cada media hora o menos.  Se siente mareado o tiene episodios de [place]. Document Released: 02/05/2005 Document Revised: 11/26/2012 [facility] Patient Information 2015 [place], [place]. This information is not intended to replace advice given to you by your health care provider. Make sure you discuss any questions you have with your health care provider.

## 2014-01-04 LAB — URINE CULTURE: Colony Count: 50000

## 2014-01-05 ENCOUNTER — Telehealth (HOSPITAL_BASED_OUTPATIENT_CLINIC_OR_DEPARTMENT_OTHER): Payer: Self-pay | Admitting: Emergency Medicine

## 2014-12-15 ENCOUNTER — Ambulatory Visit (INDEPENDENT_AMBULATORY_CARE_PROVIDER_SITE_OTHER): Payer: 59 | Admitting: Family Medicine

## 2014-12-15 VITALS — BP 110/80 | HR 75 | Temp 98.4°F | Resp 18 | Ht <= 58 in | Wt 124.2 lb

## 2014-12-15 DIAGNOSIS — R197 Diarrhea, unspecified: Secondary | ICD-10-CM | POA: Insufficient documentation

## 2014-12-15 DIAGNOSIS — A09 Infectious gastroenteritis and colitis, unspecified: Secondary | ICD-10-CM | POA: Diagnosis not present

## 2014-12-15 MED ORDER — ONDANSETRON 4 MG PO TBDP: 4.0000 mg | ORAL_TABLET | Freq: Three times a day (TID) | ORAL | Status: DC | PRN

## 2014-12-15 MED ORDER — DIPHENOXYLATE-ATROPINE 2.5-0.025 MG PO TABS: 1.0000 | ORAL_TABLET | Freq: Four times a day (QID) | ORAL | Status: DC | PRN

## 2014-12-15 NOTE — Progress Notes (Signed)
Ashley Bruce is a 49 y.o. female who presents today for diarrhea.  Diarrhea - Pain in stomach and diarrhea since last night and again this morning.  Denies hematochezia or melena, fever, chills.  Pepto and eaten apple without much relief.  Endorses nausea.  Denies anorexia as well.  No previous hx of appendectomy but did have cholecystectomy in 2015.    Past Medical History  Diagnosis Date  . Hypertension   . Fibroids   . Anemia     History  Smoking status  . Never Smoker   Smokeless tobacco  . Never Used    No family history on file.  Current Outpatient Prescriptions on File Prior to Visit  Medication Sig Dispense Refill  . tranexamic acid (LYSTEDA) 650 MG TABS Take 1,300 mg by mouth 3 (three) times daily. Only takes for 5 days of her menstrual cycle each month.    . hydrochlorothiazide (HYDRODIURIL) 25 MG tablet Take 25 mg by mouth daily.    Marland Kitchen oxyCODONE (OXY IR/ROXICODONE) 5 MG immediate release tablet Take 1 tablet (5 mg total) by mouth every 4 (four) hours as needed for severe pain. (Patient not taking: Reported on 01/01/2014) 40 tablet 0  . potassium chloride SA (K-DUR,KLOR-CON) 20 MEQ tablet Take 1 tablet (20 mEq total) by mouth daily. (Patient not taking: Reported on 01/01/2014) 30 tablet 0   No current facility-administered medications on file prior to visit.    ROS: Per HPI.  All other systems reviewed and are negative.   Physical Exam Filed Vitals:   12/15/14 1806  BP: 110/80  Pulse: 75  Temp: 98.4 F (36.9 C)  Resp: 18    Physical Examination: General appearance - alert, well appearing, and in no distress Chest - clear to auscultation, no wheezes, rales or rhonchi, symmetric air entry Heart - normal rate and regular rhythm, no murmurs noted Abdomen - soft, nontender, nondistended, no masses or organomegaly    Chemistry      Component Value Date/Time   NA 140 01/01/2014 1811   K 3.6* 01/01/2014 1811   CL 105 01/01/2014 1811   CO2 23 01/01/2014 1811    BUN 11 01/01/2014 1811   CREATININE 0.53 01/01/2014 1811      Component Value Date/Time   CALCIUM 8.6 01/01/2014 1811   ALKPHOS 70 01/01/2014 1811   AST 13 01/01/2014 1811   ALT 7 01/01/2014 1811   BILITOT 0.3 01/01/2014 1811      Lab Results  Component Value Date   WBC 7.3 01/01/2014   HGB 7.8* 01/01/2014   HCT 26.3* 01/01/2014   MCV 66.6* 01/01/2014   PLT 469* 01/01/2014   No results found for: TSH No results found for: HGBA1C

## 2014-12-15 NOTE — Assessment & Plan Note (Signed)
Viral gastroenteritis.  No red flags.   - Zofran PRN nausea.  Lomotil PRN diarrhea.   - F/U PRN - PO intake encouraged.

## 2014-12-15 NOTE — Patient Instructions (Signed)
Ondansetron injection Qu es este medicamento? El ONDANSETRN se [place] para tratar las nuseas y el vmito causados por la quimioterapia. Tambin se puede Risk manager para [place] y el vmito despus de una operacin. Este medicamento puede ser utilizado para otros usos; si tiene alguna pregunta consulte con su proveedor de atencin mdica o con su farmacutico. Qu le debo informar a mi profesional de la salud antes de tomar este medicamento? Necesita saber si usted presenta alguno de los siguientes problemas o situaciones: -enfermedad cardiaca -antecedentes de pulso cardiaco irregular -enfermedad heptica -niveles bajos de magnesio o potasio en la sangre -una reaccin alrgica o inusual al Mendel Ryder, granisetrn, a otros medicamentos, alimentos, colorantes o conservantes -si est embarazada o buscando quedar embarazada -si est amamantando a un beb Cmo debo [employer]? Este medicamento se administra mediante infusin por va intravenosa. Lo administra un profesional de Technical sales engineer en un hospital o en un entorno clnico. Hable con su pediatra para informarse acerca del uso de este medicamento en nios. Puede requerir atencin especial. Sobredosis: Pngase en contacto inmediatamente con un centro toxicolgico o una sala de urgencia si usted cree que haya tomado demasiado medicamento. ATENCIN: [employer] es solo para usted. No comparta este medicamento con nadie. Qu sucede si me olvido de una dosis? No se aplica en este caso. Qu puede interactuar con este medicamento? No tome esta medicina con ninguno de los siguientes medicamentos: apomorfina ciertos medicamentos para infecciones micticas, tales como fluconazol, quetoconazol, itraconazol, posaconazol, voriconazol cisapride dofetilida dronedarona pimozida tioridazina ziprasidona Esta medicina tambin puede interactuar con los siguientes medicamentos: carbamazepina ciertos medicamentos para la  depresin, ansiedad o trastornos psicticos fentanilo linezolid IMAOs, tales como [place], [place], [place], [place] y [place] azul de metileno (inyeccin por va intravenosa) otros medicamentos que prolongan el intervalo QT (causa un ritmo cardiaco anormal) fenitona rifampicina tramadol Puede ser que esta lista no menciona todas las posibles interacciones. Informe a su profesional de [employer]	[place] de [employer] productos a base de hierbas, medicamentos de [place] o suplementos nutritivos que est tomando. Si usted fuma, consume bebidas alcohlicas o si utiliza drogas ilegales, indqueselo tambin a su profesional de [employer]	[place]. Algunas sustancias pueden interactuar con su medicamento. A qu debo estar atento al usar [employer]? Se supervisar su estado de salud atentamente mientras reciba este medicamento. Qu efectos secundarios puedo tener al [employer] este medicamento? Efectos secundarios que debe informar a su mdico o a Barrister's clerk de la salud tan pronto como sea posible: -Chief of Staff como erupcin cutnea, picazn o urticarias, hinchazn de la cara, labios o lengua -problemas respiratorios -confusin -mareos -pulso cardiaco rpido o irregular -sensacin de desmayos o aturdimiento, cadas -fiebre y escalofros -prdida del equilibrio o coordinacin -convulsiones -sudoracin -hinchazn de las manos y pies -opresin en el pecho -temblores -cansancio o debilidad inusual Efectos secundarios que, por lo general, no requieren atencin mdica (debe informarlos a su mdico o a su profesional de la salud si persisten o si son molestos): -estreimiento o diarrea -dolor de cabeza Puede ser que esta lista no menciona todos los posibles efectos secundarios. Comunquese a su mdico por asesoramiento mdico [employer] Usted puede informar los efectos secundarios a la FDA por telfono al 1-800-FDA-1088. Dnde debo guardar mi medicina? Este medicamento se administra  en hospitales o clnicas y no necesitar guardarlo en su domicilio. ATENCIN: Este folleto es un resumen. Puede ser que no cubra toda la posible informacin. Si usted tiene preguntas acerca de esta medicina, consulte con  su mdico, su farmacutico o su profesional de [employer]	[place].    2016, Elsevier/Gold Standard. (2014-03-30 00:00:00)

## 2015-01-15 ENCOUNTER — Ambulatory Visit (INDEPENDENT_AMBULATORY_CARE_PROVIDER_SITE_OTHER): Payer: 59 | Admitting: Family Medicine

## 2015-01-15 VITALS — BP 118/78 | HR 91 | Temp 98.8°F | Resp 16 | Ht <= 58 in | Wt 122.2 lb

## 2015-01-15 DIAGNOSIS — D649 Anemia, unspecified: Secondary | ICD-10-CM

## 2015-01-15 DIAGNOSIS — R05 Cough: Secondary | ICD-10-CM | POA: Diagnosis not present

## 2015-01-15 DIAGNOSIS — R059 Cough, unspecified: Secondary | ICD-10-CM

## 2015-01-15 DIAGNOSIS — R52 Pain, unspecified: Secondary | ICD-10-CM | POA: Diagnosis not present

## 2015-01-15 DIAGNOSIS — J209 Acute bronchitis, unspecified: Secondary | ICD-10-CM | POA: Diagnosis not present

## 2015-01-15 LAB — POCT CBC
GRANULOCYTE PERCENT: 83.6 % — AB (ref 37–80)
HEMATOCRIT: 28.2 % — AB (ref 37.7–47.9)
Hemoglobin: 8.6 g/dL — AB (ref 12.2–16.2)
Lymph, poc: 1.6 (ref 0.6–3.4)
MCH, POC: 19.5 pg — AB (ref 27–31.2)
MCHC: 30.3 g/dL — AB (ref 31.8–35.4)
MCV: 64.4 fL — AB (ref 80–97)
MID (CBC): 0.4 (ref 0–0.9)
MPV: 6.8 fL (ref 0–99.8)
POC GRANULOCYTE: 10 — AB (ref 2–6.9)
POC LYMPH %: 13.3 % (ref 10–50)
POC MID %: 3.1 %M (ref 0–12)
Platelet Count, POC: 411 10*3/uL (ref 142–424)
RBC: 4.38 M/uL (ref 4.04–5.48)
RDW, POC: 18.1 %
WBC: 12 10*3/uL — AB (ref 4.6–10.2)

## 2015-01-15 LAB — POCT INFLUENZA A/B
INFLUENZA A, POC: NEGATIVE
Influenza B, POC: NEGATIVE

## 2015-01-15 MED ORDER — CEFDINIR 300 MG PO CAPS: 300.0000 mg | ORAL_CAPSULE | Freq: Two times a day (BID) | ORAL | Status: DC

## 2015-01-15 MED ORDER — HYDROCODONE-HOMATROPINE 5-1.5 MG/5ML PO SYRP: 5.0000 mL | ORAL_SOLUTION | Freq: Three times a day (TID) | ORAL | Status: DC | PRN

## 2015-01-15 NOTE — Progress Notes (Signed)
[facility] [address] [place] [address] [phone]  Date:  01/15/2015   Name:  Ashley Bruce   DOB:  [birth year]   MRN:  [id number]  PCP:  No PCP Per Patient    Chief Complaint: Cough and Headache   History of Present Illness:  Ashley Bruce is a 49 y.o. very pleasant female patient who presents with the following:  Here today with illness- she has noted a cough for about 4-5 days.  She has also noted a headache.  They think that she has had a fever, she has noted a dry mouth, chills. She has noted aches in her chest Cough can be productive Her throat is irritated They tried some OTC cough syrup- helped just a little bit, also ibuprofen for her HA Feeling very tired, no GI symptoms  No sick contacts  LMP 11/3 She is generally in good health - treated for HTN with lisinopril and HCTZ Normal glucose last year She has a history of anemia  Patient Active Problem List   Diagnosis Date Noted  . Diarrhea 12/15/2014  . S/P laparoscopic cholecystectomy 12/07/2013    Past Medical History  Diagnosis Date  . Hypertension   . Fibroids   . Anemia     Past Surgical History  Procedure Laterality Date  . Cholecystectomy  12/07/2013  . Cesarean section    . Cholecystectomy N/A 12/07/2013    Procedure: LAPAROSCOPIC CHOLECYSTECTOMY WITH INTRAOPERATIVE CHOLANGIOGRAM;  Surgeon: Coralie Keens, MD;  Location: [facility];  Service: General;  Laterality: N/A;    Social History  Substance Use Topics  . Smoking status: Never Smoker   . Smokeless tobacco: Never Used  . Alcohol Use: No    History reviewed. No pertinent family history.  Allergies  Allergen Reactions  . Tylenol [Acetaminophen] Itching and Other (See Comments)    Blood pressure rises    Medication list has been reviewed and updated.  Current Outpatient Prescriptions on File Prior to Visit  Medication Sig Dispense Refill  . hydrochlorothiazide (HYDRODIURIL) 25 MG tablet Take 25 mg  by mouth daily.    Marland Kitchen lisinopril (PRINIVIL,ZESTRIL) 2.5 MG tablet Take 2.5 mg by mouth daily.    . tranexamic acid (LYSTEDA) 650 MG TABS Take 1,300 mg by mouth 3 (three) times daily. Only takes for 5 days of her menstrual cycle each month.    . diphenoxylate-atropine (LOMOTIL) 2.5-0.025 MG tablet Take 1 tablet by mouth 4 (four) times daily as needed for diarrhea or loose stools. (Patient not taking: Reported on 01/15/2015) 45 tablet 0  . ondansetron (ZOFRAN ODT) 4 MG disintegrating tablet Take 1 tablet (4 mg total) by mouth every 8 (eight) hours as needed for nausea or vomiting. (Patient not taking: Reported on 01/15/2015) 20 tablet 0  . oxyCODONE (OXY IR/ROXICODONE) 5 MG immediate release tablet Take 1 tablet (5 mg total) by mouth every 4 (four) hours as needed for severe pain. (Patient not taking: Reported on 01/01/2014) 40 tablet 0  . potassium chloride SA (K-DUR,KLOR-CON) 20 MEQ tablet Take 1 tablet (20 mEq total) by mouth daily. (Patient not taking: Reported on 01/01/2014) 30 tablet 0   No current facility-administered medications on file prior to visit.    Review of Systems:  As per HPI- otherwise negative.'  Physical Examination: Filed Vitals:   01/15/15 1330  BP: 118/78  Pulse: 91  Temp: 98.8 F (37.1 C)  Resp: 16   Filed Vitals:   01/15/15 1330  Height: 4\' 8"  (1.422 m)  Weight: 122 lb 3.2 oz (55.43 kg)   Body mass index is 27.41 kg/(m^2). Ideal Body Weight: Weight in (lb) to have BMI = 25: 111.3  GEN: WDWN, NAD, Non-toxic, A & O x 3 HEENT: Atraumatic, Normocephalic. Neck supple. No masses, No LAD. Ears and Nose: No external deformity. CV: RRR, No M/G/R. No JVD. No thrill. No extra heart sounds. PULM: CTA B, no wheezes, crackles, rhonchi. No retractions. No resp. distress. No accessory muscle use. ABD: S, NT, ND, +BS. No rebound. No HSM. EXTR: No c/c/e NEURO Normal gait.  PSYCH: Normally interactive. Conversant. Not depressed or anxious appearing.  Calm demeanor.    Results for orders placed or performed in visit on 01/15/15  POCT CBC  Result Value Ref Range   WBC 12.0 (A) 4.6 - 10.2 K/uL   Lymph, poc 1.6 0.6 - 3.4   POC LYMPH PERCENT 13.3 10 - 50 %L   MID (cbc) 0.4 0 - 0.9   POC MID % 3.1 0 - 12 %M   POC Granulocyte 10.0 (A) 2 - 6.9   Granulocyte percent 83.6 (A) 37 - 80 %G   RBC 4.38 4.04 - 5.48 M/uL   Hemoglobin 8.6 (A) 12.2 - 16.2 g/dL   HCT, POC 28.2 (A) 37.7 - 47.9 %   MCV 64.4 (A) 80 - 97 fL   MCH, POC 19.5 (A) 27 - 31.2 pg   MCHC 30.3 (A) 31.8 - 35.4 g/dL   RDW, POC 18.1 %   Platelet Count, POC 411 142 - 424 K/uL   MPV 6.8 0 - 99.8 fL  POCT Influenza A/B  Result Value Ref Range   Influenza A, POC Negative Negative   Influenza B, POC Negative Negative    Assessment and Plan: Acute bronchitis, unspecified organism - Plan: cefdinir (OMNICEF) 300 MG capsule  Cough - Plan: POCT CBC, POCT Influenza A/B, HYDROcodone-homatropine (HYCODAN) 5-1.5 MG/5ML syrup  Body aches - Plan: POCT CBC, POCT Influenza A/B  Anemia, unspecified anemia type   Treat for bronchitis with leukocytosis with omnicef Cough syrup as needed- cautioned regarding sedation Follow-up if not feeling better Noted anemia- this is not new and is actually better from last labs.   However she does need further follow-up.  Will send her a letter to this effect  Signed Lamar Blinks, MD

## 2015-01-15 NOTE — Patient Instructions (Signed)
We are going to treat you for bronchitis (chest infection) with omnicef - this is an antibiotic You can also use the cough medication as needed for cough- however do not use it when you need to drive as it can make you sleepy Let me know if you are not better in the next few days- Sooner if worse.

## 2015-01-21 ENCOUNTER — Telehealth: Payer: Self-pay

## 2015-01-21 NOTE — Telephone Encounter (Signed)
Yes she needs a visit to be evaluated by [place] if we are to do a referral.

## 2015-01-21 NOTE — Telephone Encounter (Signed)
Please advise 

## 2015-01-21 NOTE — Telephone Encounter (Signed)
Patient is an established patient at Dr Cyndie Chime office, an OBGYN.  They referred the patient to a GI specialist in July, but realized that she would need a [facility] referral from her PCP.  They told her to cancel the appointment and wait until she changed her PCP and received authorization for the GI specialist, but she went to the facility anyway--without insurance authorization.  She speaks Spanish and had difficulties understanding the [facility] process.  Her PCP was originally with a facility that she had never been to.  She re-established care at [facility] and changed her PCP to a provider here.  They would like [place] to do a retro authorization for the GI specialist; however, the patient did not have any GI complaints at her last OV with [place].  Should we advise the patient to come in for the GI concerns, and then attempt to do the retro?  There is no guarantee that they will authorize the referral, since they are not obligated to do so, and it has been over six months since this OV occurred.  Please advise on how to proceed.  Thank you.

## 2018-04-20 ENCOUNTER — Observation Stay (HOSPITAL_COMMUNITY): Payer: Self-pay

## 2018-04-20 ENCOUNTER — Observation Stay (HOSPITAL_COMMUNITY)
Admission: EM | Admit: 2018-04-20 | Discharge: 2018-04-21 | Disposition: A | Discharge status: Home / Self Care | Payer: Self-pay | Attending: Student | Admitting: Student

## 2018-04-20 ENCOUNTER — Other Ambulatory Visit: Payer: Self-pay

## 2018-04-20 ENCOUNTER — Encounter (HOSPITAL_COMMUNITY): Payer: Self-pay

## 2018-04-20 ENCOUNTER — Emergency Department (HOSPITAL_COMMUNITY): Payer: Self-pay

## 2018-04-20 DIAGNOSIS — R55 Syncope and collapse: Principal | ICD-10-CM | POA: Insufficient documentation

## 2018-04-20 DIAGNOSIS — Z791 Long term (current) use of non-steroidal anti-inflammatories (NSAID): Secondary | ICD-10-CM | POA: Insufficient documentation

## 2018-04-20 DIAGNOSIS — M25512 Pain in left shoulder: Secondary | ICD-10-CM | POA: Insufficient documentation

## 2018-04-20 DIAGNOSIS — Z79899 Other long term (current) drug therapy: Secondary | ICD-10-CM | POA: Insufficient documentation

## 2018-04-20 DIAGNOSIS — D649 Anemia, unspecified: Secondary | ICD-10-CM | POA: Insufficient documentation

## 2018-04-20 DIAGNOSIS — I10 Essential (primary) hypertension: Secondary | ICD-10-CM | POA: Insufficient documentation

## 2018-04-20 DIAGNOSIS — M25511 Pain in right shoulder: Secondary | ICD-10-CM | POA: Insufficient documentation

## 2018-04-20 LAB — URINALYSIS, ROUTINE W REFLEX MICROSCOPIC
Bilirubin Urine: NEGATIVE
Glucose, UA: NEGATIVE mg/dL
Ketones, ur: NEGATIVE mg/dL
Leukocytes,Ua: NEGATIVE
Nitrite: NEGATIVE
Protein, ur: NEGATIVE mg/dL
Specific Gravity, Urine: 1.019 (ref 1.005–1.030)
pH: 7 (ref 5.0–8.0)

## 2018-04-20 LAB — COMPREHENSIVE METABOLIC PANEL
ALT: 21 U/L (ref 0–44)
AST: 24 U/L (ref 15–41)
Albumin: 4 g/dL (ref 3.5–5.0)
Alkaline Phosphatase: 58 U/L (ref 38–126)
Anion gap: 8 (ref 5–15)
BUN: 10 mg/dL (ref 6–20)
CO2: 25 mmol/L (ref 22–32)
Calcium: 9 mg/dL (ref 8.9–10.3)
Chloride: 106 mmol/L (ref 98–111)
Creatinine, Ser: 0.6 mg/dL (ref 0.44–1.00)
GFR calc non Af Amer: >60 mL/min (ref >60)
Glucose, Bld: 104 mg/dL — ABNORMAL HIGH (ref 70–99)
Potassium: 3.3 mmol/L — ABNORMAL LOW (ref 3.5–5.1)
SODIUM: 139 mmol/L (ref 135–145)
Total Bilirubin: 0.6 mg/dL (ref 0.3–1.2)
Total Protein: 7.4 g/dL (ref 6.5–8.1)

## 2018-04-20 LAB — I-STAT CREATININE, ED: Creatinine, Ser: 0.5 mg/dL (ref 0.44–1.00)

## 2018-04-20 LAB — CBC WITH DIFFERENTIAL/PLATELET
Abs Immature Granulocytes: 0.04 10*3/uL (ref 0.00–0.07)
Basophils Absolute: 0 10*3/uL (ref 0.0–0.1)
Basophils Relative: 0 %
Eosinophils Absolute: 0.1 10*3/uL (ref 0.0–0.5)
Eosinophils Relative: 1 %
HCT: 42 % (ref 36.0–46.0)
Hemoglobin: 14.4 g/dL (ref 12.0–15.0)
Immature Granulocytes: 0 %
Lymphocytes Relative: 17 %
Lymphs Abs: 1.6 10*3/uL (ref 0.7–4.0)
MCH: 30.1 pg (ref 26.0–34.0)
MCHC: 34.3 g/dL (ref 30.0–36.0)
MCV: 87.9 fL (ref 80.0–100.0)
Monocytes Absolute: 0.6 10*3/uL (ref 0.1–1.0)
Monocytes Relative: 6 %
Neutro Abs: 6.9 10*3/uL (ref 1.7–7.7)
Neutrophils Relative %: 76 %
Platelets: 274 10*3/uL (ref 150–400)
RBC: 4.78 MIL/uL (ref 3.87–5.11)
RDW: 13.3 % (ref 11.5–15.5)
WBC: 9.1 10*3/uL (ref 4.0–10.5)
nRBC: 0 % (ref 0.0–0.2)

## 2018-04-20 LAB — C-REACTIVE PROTEIN: CRP: 0.5 mg/dL (ref <1.0)

## 2018-04-20 LAB — SEDIMENTATION RATE: Sed Rate: 5 mm/hr (ref 0–22)

## 2018-04-20 LAB — CK: Total CK: 84 U/L (ref 38–234)

## 2018-04-20 LAB — TROPONIN I: Troponin I: <0.03 ng/mL (ref <0.03)

## 2018-04-20 LAB — TSH: TSH: 2.236 u[IU]/mL (ref 0.350–4.500)

## 2018-04-20 LAB — CBG MONITORING, ED: GLUCOSE-CAPILLARY: 87 mg/dL (ref 70–99)

## 2018-04-20 MED ORDER — SODIUM CHLORIDE 0.9 % IV BOLUS
500.0000 mL | Freq: Once | INTRAVENOUS | Status: AC
  Administered 2018-04-20: 500 mL via INTRAVENOUS

## 2018-04-20 MED ORDER — SODIUM CHLORIDE (PF) 0.9 % IJ SOLN
INTRAMUSCULAR | Status: AC
  Filled 2018-04-20: qty 50

## 2018-04-20 MED ORDER — METOCLOPRAMIDE HCL 5 MG/ML IJ SOLN
10.0000 mg | Freq: Once | INTRAMUSCULAR | Status: AC
  Administered 2018-04-20: 10 mg via INTRAVENOUS
  Filled 2018-04-20: qty 2

## 2018-04-20 MED ORDER — POLYETHYLENE GLYCOL 3350 17 G PO PACK: 17.0000 g | PACK | Freq: Every day | ORAL | Status: DC | PRN

## 2018-04-20 MED ORDER — SODIUM CHLORIDE 0.9% FLUSH: 3.0000 mL | Freq: Two times a day (BID) | INTRAVENOUS | Status: DC

## 2018-04-20 MED ORDER — ONDANSETRON HCL 4 MG PO TABS: 4.0000 mg | ORAL_TABLET | Freq: Four times a day (QID) | ORAL | Status: DC | PRN

## 2018-04-20 MED ORDER — SODIUM CHLORIDE 0.9 % IV SOLN
INTRAVENOUS | Status: DC
  Administered 2018-04-20 – 2018-04-21 (×2): via INTRAVENOUS

## 2018-04-20 MED ORDER — ENOXAPARIN SODIUM 40 MG/0.4ML ~~LOC~~ SOLN
40.0000 mg | SUBCUTANEOUS | Status: DC
  Administered 2018-04-21: 40 mg via SUBCUTANEOUS
  Filled 2018-04-20: qty 0.4

## 2018-04-20 MED ORDER — INFLUENZA VAC SPLIT QUAD 0.5 ML IM SUSY
0.5000 mL | PREFILLED_SYRINGE | INTRAMUSCULAR | Status: DC
  Filled 2018-04-20: qty 0.5

## 2018-04-20 MED ORDER — PROCHLORPERAZINE EDISYLATE 10 MG/2ML IJ SOLN
10.0000 mg | Freq: Once | INTRAMUSCULAR | Status: AC
  Administered 2018-04-20: 10 mg via INTRAVENOUS
  Filled 2018-04-20: qty 2

## 2018-04-20 MED ORDER — IOHEXOL 350 MG/ML SOLN
100.0000 mL | Freq: Once | INTRAVENOUS | Status: AC | PRN
  Administered 2018-04-20: 100 mL via INTRAVENOUS

## 2018-04-20 MED ORDER — DIPHENHYDRAMINE HCL 50 MG/ML IJ SOLN
12.5000 mg | Freq: Once | INTRAMUSCULAR | Status: AC
  Administered 2018-04-20: 12.5 mg via INTRAVENOUS
  Filled 2018-04-20: qty 1

## 2018-04-20 MED ORDER — ONDANSETRON HCL 4 MG/2ML IJ SOLN: 4.0000 mg | Freq: Four times a day (QID) | INTRAMUSCULAR | Status: DC | PRN

## 2018-04-20 NOTE — ED Notes (Signed)
Bed: KL50 Expected date:  Expected time:  Means of arrival:  Comments: syncope

## 2018-04-20 NOTE — ED Triage Notes (Signed)
Her son tells [place] pt. Has had a headache for about a week now. She had a syncope at home today and family phoned EMS. Pt. Speaks functional English and confirms these statements. She denies any fever, vomiting, diarrhea or cough and is awake and alert. Her neck is supple.

## 2018-04-20 NOTE — H&P (Signed)
[facility] History and Physical   Patient: Ashley Bruce [id number]:[id number]   PCP: Patient, No Pcp Per DOB: [birth year]   DOA: 04/20/2018   DOS: 04/20/2018   DOS: the patient was seen and examined on 04/20/2018  Patient coming from: The patient is coming from home.  Chief Complaint: passing out episode  HPI: Avleen Lavalle is a 53 y.o. female with Past medical history of hypertension. The history was taken with the help of interpreter.  Patient speaks Spanish. Patient has been complaining of headache for past few days.  The headache is been located in the occipital region and it is a continuous headache. Taking medications like Aleve improves the headache.  Nothing worsens the headache. The headache is been reported as a dull continuous ache without any throbbing. Today while the patient was sitting on the breakfast table she had an unresponsive episode.  Prior to the event patient did have some abdominal pain and felt warm. She does not remember what happened.  Her daughter was in the room and she heard a thud and did not see the event.  There was no seizure-like event no loss of control of bowel or bladder.  She remained unconscious for about a minute and after that she was somewhat confused.  There were some tingling and numbness reported which currently has resolved. No nausea no vomiting no fever no chills no recent change in medication.  No drug abuse no alcohol abuse.  No active smoking reported.  No similar episodes in the past reported.  No seizures in the past reported. On my examination when the patient had some weakness on the left upper extremity she mentions that she has bilateral shoulder pain ongoing for last 1 month with left shoulder affected more than the right.  ED Course: Underwent CT of the head and neck with angiogram which were negative for any acute abnormality.  Patient was referred for admission for syncope.  At her baseline ambulates without any support And is  independent for most of her ADL; manages her medication on her own.  Review of Systems: as mentioned in the history of present illness.  All other systems reviewed and are negative.  Past Medical History:  Diagnosis Date  . Anemia   . Fibroids   . Hypertension    Past Surgical History:  Procedure Laterality Date  . CESAREAN SECTION    . CHOLECYSTECTOMY  12/07/2013  . CHOLECYSTECTOMY N/A 12/07/2013   Procedure: LAPAROSCOPIC CHOLECYSTECTOMY WITH INTRAOPERATIVE CHOLANGIOGRAM;  Surgeon: Coralie Keens, MD;  Location: [facility];  Service: General;  Laterality: N/A;   Social History:  reports that she has never smoked. She has never used smokeless tobacco. She reports that she does not drink alcohol or use drugs.  Allergies  Allergen Reactions  . Tylenol [Acetaminophen] Itching and Other (See Comments)    Blood pressure rises     History reviewed. No pertinent family history.   Prior to Admission medications   Medication Sig Start Date End Date Taking? Authorizing Provider  IBUPROFEN PO Take by mouth.   Yes [provider]  lisinopril (PRINIVIL,ZESTRIL) 5 MG tablet Take 5 mg by mouth daily.   Yes [provider]    Physical Exam: Vitals:   04/20/18 1518 04/20/18 1543 04/20/18 1544 04/20/18 1725  BP: 134/88     Pulse: 71     Resp:      Temp: 98.4 F (36.9 C)     TempSrc: Oral     SpO2: 100%  Weight:    59 kg  Height:  4' 10"  (1.473 m) 4' 11"  (1.499 m)     General: Alert, Awake and Oriented to Time, Place and Person. Appear in no distress, affect appropriate Eyes: PERRL, Conjunctiva normal ENT: Oral Mucosa clear moist. Neck: no JVD, no Abnormal Mass Or lumps Cardiovascular: S1 and S2 Present, no Murmur, Peripheral Pulses Present Respiratory: normal respiratory effort, Bilateral Air entry equal and Decreased, no use of accessory muscle, Clear to Auscultation, no Crackles, no wheezes Abdomen: Bowel Sound present, Soft and no tenderness, no  hernia Skin: no redness, no Rash, no induration Extremities: no Pedal edema, no calf tenderness Neurologic: Mental status AAOx3, speech normal, attention normal,  Cranial Nerves PERRL, EOM normal and present, Motor strength bilateral equal strength 5/5,  Sensation present to light touch,  Reflexes present knee and biceps, babinski negative,  Cerebellar test normal finger nose finger. Gait not checked due to patient safety concerns.  Labs on Admission:  CBC: Recent Labs  Lab 04/20/18 1126  WBC 9.1  NEUTROABS 6.9  HGB 14.4  HCT 42.0  MCV 87.9  PLT 093   Basic Metabolic Panel: Recent Labs  Lab 04/20/18 1126 04/20/18 1137  NA 139  --   K 3.3*  --   CL 106  --   CO2 25  --   GLUCOSE 104*  --   BUN 10  --   CREATININE 0.60 0.50  CALCIUM 9.0  --    GFR: Estimated Creatinine Clearance: 62.8 mL/min (by C-G formula based on SCr of 0.5 mg/dL). Liver Function Tests: Recent Labs  Lab 04/20/18 1126  AST 24  ALT 21  ALKPHOS 58  BILITOT 0.6  PROT 7.4  ALBUMIN 4.0   No results for input(s): LIPASE, AMYLASE in the last 168 hours. No results for input(s): AMMONIA in the last 168 hours. Coagulation Profile: No results for input(s): INR, PROTIME in the last 168 hours. Cardiac Enzymes: Recent Labs  Lab 04/20/18 1126 04/20/18 1531  CKTOTAL  --  84  TROPONINI <0.03  --    BNP (last 3 results) No results for input(s): PROBNP in the last 8760 hours. HbA1C: No results for input(s): HGBA1C in the last 72 hours. CBG: Recent Labs  Lab 04/20/18 1141  GLUCAP 87   Lipid Profile: No results for input(s): CHOL, HDL, LDLCALC, TRIG, CHOLHDL, LDLDIRECT in the last 72 hours. Thyroid Function Tests: Recent Labs    04/20/18 1531  TSH 2.236   Anemia Panel: No results for input(s): VITAMINB12, FOLATE, FERRITIN, TIBC, IRON, RETICCTPCT in the last 72 hours. Urine analysis:    Component Value Date/Time   COLORURINE STRAW (A) 04/20/2018 1217   APPEARANCEUR CLEAR 04/20/2018 1217    LABSPEC 1.019 04/20/2018 1217   PHURINE 7.0 04/20/2018 1217   GLUCOSEU NEGATIVE 04/20/2018 1217   HGBUR MODERATE (A) 04/20/2018 1217   BILIRUBINUR NEGATIVE 04/20/2018 1217   KETONESUR NEGATIVE 04/20/2018 1217   PROTEINUR NEGATIVE 04/20/2018 1217   UROBILINOGEN 0.2 01/01/2014 2108   NITRITE NEGATIVE 04/20/2018 1217   LEUKOCYTESUR NEGATIVE 04/20/2018 1217    Radiological Exams on Admission: Ct Angio Head W/cm &/or Wo Cm  Result Date: 04/20/2018 CLINICAL DATA:  Severe headache. Syncope today. Right upper extremity paresthesia. EXAM: CT ANGIOGRAPHY HEAD AND NECK TECHNIQUE: Multidetector CT imaging of the head and neck was performed using the standard protocol during bolus administration of intravenous contrast. Multiplanar CT image reconstructions and MIPs were obtained to evaluate the vascular anatomy. Carotid stenosis measurements (when applicable) are obtained  utilizing NASCET criteria, using the distal internal carotid diameter as the denominator. CONTRAST:  160m OMNIPAQUE IOHEXOL 350 MG/ML SOLN COMPARISON:  Head CT 05/09/2004 FINDINGS: CT HEAD FINDINGS Brain: There is no evidence of acute infarct, intracranial hemorrhage, mass, midline shift, or extra-axial fluid collection. The ventricles and sulci are within normal limits for age. Vascular: No hyperdense vessel. Skull: No fracture or focal osseous lesion. Mild frontal scalp swelling. Sinuses: Paranasal sinuses and mastoid air cells are clear. Orbits: Unremarkable. Review of the MIP images confirms the above findings CTA NECK FINDINGS Aortic arch: Standard 3 vessel aortic arch with widely patent arch vessel origins. Right carotid system: Patent and smooth without evidence of stenosis or dissection. Left carotid system: Patent and smooth without evidence of stenosis or dissection. Vertebral arteries: Strongly dominant and widely patent right vertebral artery. Diffusely hypoplastic left vertebral artery with its V1 and proximal V2 segments obscured  by venous contrast. The more distal left V2 and V3 segments are patent without evidence of significant focal stenosis. Skeleton: No acute osseous abnormality or suspicious osseous lesion. Other neck: 1 cm low-density focus in the superior aspect of the left palatine tonsil. No hyperenhancing pharyngeal mass or significant parapharyngeal or retropharyngeal space inflammation is identified. Upper chest: Clear lung apices. Review of the MIP images confirms the above findings CTA HEAD FINDINGS Anterior circulation: The internal carotid arteries are widely patent from skull base to carotid termini. ACAs and MCAs are patent without evidence of proximal branch occlusion or significant proximal stenosis. No aneurysm is identified. Posterior circulation: The intracranial vertebral arteries are patent to the basilar. Patent PICAs and SCAs are seen bilaterally. The basilar artery is widely patent. Posterior communicating arteries are not clearly identified and may be diminutive or absent. PCAs are patent with branch vessel irregularity but no significant proximal stenosis. No aneurysm is identified. Venous sinuses: Patent. Anatomic variants: None. Delayed phase: No abnormal enhancement. Review of the MIP images confirms the above findings IMPRESSION: 1. Mild frontal scalp swelling without evidence of acute intracranial abnormality. 2. No large vessel occlusion or significant arterial stenosis in the head and neck. Congenitally hypoplastic left vertebral artery. 3. 1 cm hypodensity in the left palatine tonsil, favor a cyst over an abscess or neoplasm in this setting. Correlate with direct visualization. Electronically Signed   By: ALogan BoresM.D.   On: 04/20/2018 12:27   X-ray Chest Pa And Lateral  Result Date: 04/20/2018 CLINICAL DATA:  Syncope EXAM: CHEST - 2 VIEW COMPARISON:  12/07/2013 FINDINGS: The heart size and mediastinal contours are within normal limits. Both lungs are clear. The visualized skeletal structures  are unremarkable. IMPRESSION: No active cardiopulmonary disease. Electronically Signed   By: HKathreen Devoid  On: 04/20/2018 16:47   Ct Angio Neck W And/or Wo Contrast  Result Date: 04/20/2018 CLINICAL DATA:  Severe headache. Syncope today. Right upper extremity paresthesia. EXAM: CT ANGIOGRAPHY HEAD AND NECK TECHNIQUE: Multidetector CT imaging of the head and neck was performed using the standard protocol during bolus administration of intravenous contrast. Multiplanar CT image reconstructions and MIPs were obtained to evaluate the vascular anatomy. Carotid stenosis measurements (when applicable) are obtained utilizing NASCET criteria, using the distal internal carotid diameter as the denominator. CONTRAST:  1035mOMNIPAQUE IOHEXOL 350 MG/ML SOLN COMPARISON:  Head CT 05/09/2004 FINDINGS: CT HEAD FINDINGS Brain: There is no evidence of acute infarct, intracranial hemorrhage, mass, midline shift, or extra-axial fluid collection. The ventricles and sulci are within normal limits for age. Vascular: No hyperdense vessel. Skull: No  fracture or focal osseous lesion. Mild frontal scalp swelling. Sinuses: Paranasal sinuses and mastoid air cells are clear. Orbits: Unremarkable. Review of the MIP images confirms the above findings CTA NECK FINDINGS Aortic arch: Standard 3 vessel aortic arch with widely patent arch vessel origins. Right carotid system: Patent and smooth without evidence of stenosis or dissection. Left carotid system: Patent and smooth without evidence of stenosis or dissection. Vertebral arteries: Strongly dominant and widely patent right vertebral artery. Diffusely hypoplastic left vertebral artery with its V1 and proximal V2 segments obscured by venous contrast. The more distal left V2 and V3 segments are patent without evidence of significant focal stenosis. Skeleton: No acute osseous abnormality or suspicious osseous lesion. Other neck: 1 cm low-density focus in the superior aspect of the left palatine  tonsil. No hyperenhancing pharyngeal mass or significant parapharyngeal or retropharyngeal space inflammation is identified. Upper chest: Clear lung apices. Review of the MIP images confirms the above findings CTA HEAD FINDINGS Anterior circulation: The internal carotid arteries are widely patent from skull base to carotid termini. ACAs and MCAs are patent without evidence of proximal branch occlusion or significant proximal stenosis. No aneurysm is identified. Posterior circulation: The intracranial vertebral arteries are patent to the basilar. Patent PICAs and SCAs are seen bilaterally. The basilar artery is widely patent. Posterior communicating arteries are not clearly identified and may be diminutive or absent. PCAs are patent with branch vessel irregularity but no significant proximal stenosis. No aneurysm is identified. Venous sinuses: Patent. Anatomic variants: None. Delayed phase: No abnormal enhancement. Review of the MIP images confirms the above findings IMPRESSION: 1. Mild frontal scalp swelling without evidence of acute intracranial abnormality. 2. No large vessel occlusion or significant arterial stenosis in the head and neck. Congenitally hypoplastic left vertebral artery. 3. 1 cm hypodensity in the left palatine tonsil, favor a cyst over an abscess or neoplasm in this setting. Correlate with direct visualization. Electronically Signed   By: Logan Bores M.D.   On: 04/20/2018 12:27   Dg Shoulder Left  Result Date: 04/20/2018 CLINICAL DATA:  Left shoulder pain EXAM: LEFT SHOULDER - 2+ VIEW COMPARISON:  None. FINDINGS: No fracture or dislocation is seen. The joint spaces are preserved. The visualized soft tissues are unremarkable. Visualized left lung is clear. IMPRESSION: Negative. Electronically Signed   By: Julian Hy M.D.   On: 04/20/2018 16:50   EKG: Independently reviewed. normal sinus rhythm, nonspecific ST and T waves changes.  Assessment/Plan 1.  Syncope. Patient presents with  complaints of a passing out event. Etiology is not clear although patient does have decent amount of facial injury. So far CT of the head, CT angiogram of the head and neck, EKG, orthostatic vitals, unremarkable. On neurological examination patient does have some left-sided weakness but she grabbed that to her left shoulder pain. With this we will admit the patient to the hospital. Provide her with IV hydration. Monitor on telemetry. Get MRI brain without contrast. Will check echocardiogram as well. Also check EEG. PT OT consulted as well. Daily orthostatic vitals. If the work-up is unremarkable patient will require 30-day event monitoring.  2.  Facial injury. Should heal on their own. We will monitor. No Fracture identified on the CT scan of  3.  Bilateral shoulder pain. Reported ongoing for last 1 month. We will check ESR and CRP. X-ray of the left shoulder as well. Monitor the results.  Nutrition: regular diet DVT Prophylaxis: subcutaneous Heparin  Advance goals of care discussion: full code   Consults:  none  Family Communication: family was present at bedside, at the time of interview.  Opportunity was given to ask question and all questions were answered satisfactorily.  Disposition: Admitted as observation, telemetry unit. Likely to be discharged home, in 1-2 days.  Author: Berle Mull, MD [facility] 04/20/2018  To reach On-call, see care teams to locate the attending and reach out to them via [email or web address]. If 7PM-7AM, please contact night-coverage If you still have difficulty reaching the attending provider, please page the [facility] (Director on Call) for [facility] on amion for assistance.

## 2018-04-20 NOTE — ED Provider Notes (Signed)
[facility] DEPT Provider Note   CSN: [id number] Arrival date & time: 04/20/18  1042    History   Chief Complaint Chief Complaint  Patient presents with  . Headache  . Loss of Consciousness    HPI Ashley Bruce is a 53 y.o. female.     The history is provided by the patient, medical records, a relative and the EMS personnel. No language interpreter was used.  Headache  Associated symptoms: syncope   Loss of Consciousness  Associated symptoms: headaches    Ashley Bruce is a 53 y.o. female who presents to the Emergency Department complaining of HA/syncope. Patient presents to the emergency department by EMS accompanied by her son for evaluation of headache and syncopal event. She has experienced a severe stabbing headache since Tuesday of last week. Headache is located in the occipital region and radiates to the front of her head. It is been waxing and waning since it started. Today she was sitting at the breakfast nook on a bench when she had a syncopal event. This was witnessed by her daughter, who is not available for questioning on ED arrival. Per report from the patient's son she fell forward with her head striking the table and she was unconscious for about one minute. No witness seizure activity. The patient has experienced intermittent paresthesias to the left arm for one month, today she feels altered sensation in the right arm. She has a history of hypertension and is compliant with her medications. No additional medical problems. She has associated nausea and abdominal discomfort. No fevers, chest pain, vomiting, shortness of breath.  Additional history available from daughter after patient's initial ED presentation. Daughter states that the patient was sitting on a bench talking on the phone when the daughter heard a thump. She found the patient face down on the ground with a fine tremor. The patient was on responsive a blank expression on her  face after she was rolled over. The fine tremor stopped when the daughter touched the patient. The daughter did not describe it is seizure activity. When the patient began to awake several seconds later she put her hand to her nose and noticed that there was bleeding and asked what happened. The patient did have abdominal pain and felt hot just before passing out. Currently she has no abdominal pain. Past Medical History:  Diagnosis Date  . Anemia   . Fibroids   . Hypertension     Patient Active Problem List   Diagnosis Date Noted  . Syncope 04/20/2018  . Diarrhea 12/15/2014  . S/P laparoscopic cholecystectomy 12/07/2013    Past Surgical History:  Procedure Laterality Date  . CESAREAN SECTION    . CHOLECYSTECTOMY  12/07/2013  . CHOLECYSTECTOMY N/A 12/07/2013   Procedure: LAPAROSCOPIC CHOLECYSTECTOMY WITH INTRAOPERATIVE CHOLANGIOGRAM;  Surgeon: Coralie Keens, MD;  Location: [facility];  Service: General;  Laterality: N/A;     OB History   No obstetric history on file.      Home Medications    Prior to Admission medications   Medication Sig Start Date End Date Taking? Authorizing Provider  IBUPROFEN PO Take by mouth.   Yes [provider]  lisinopril (PRINIVIL,ZESTRIL) 5 MG tablet Take 5 mg by mouth daily.   Yes [provider]    Family History History reviewed. No pertinent family history.  Social History Social History   Tobacco Use  . Smoking status: Never Smoker  . Smokeless tobacco: Never Used  Substance Use Topics  .  Alcohol use: No  . Drug use: No     Allergies   Tylenol [acetaminophen]   Review of Systems Review of Systems  Cardiovascular: Positive for syncope.  Neurological: Positive for headaches.  All other systems reviewed and are negative.    Physical Exam Updated Vital Signs BP 134/88 (BP Location: Right Arm)   Pulse 71   Temp 98.4 F (36.9 C) (Oral)   Resp 15   Ht 4\' 11"  (1.499 m)   LMP  (LMP Unknown)   SpO2 100%    BMI 24.68 kg/m   Physical Exam Vitals signs and nursing note reviewed.  Constitutional:      Appearance: She is well-developed.  HENT:     Head: Normocephalic.     Comments: Ecchymosis and hematoma to the left forehead. Laceration to the inner aspect of the upper lip. Cardiovascular:     Rate and Rhythm: Normal rate and regular rhythm.     Heart sounds: No murmur.  Pulmonary:     Effort: Pulmonary effort is normal. No respiratory distress.     Breath sounds: Normal breath sounds.  Abdominal:     Palpations: Abdomen is soft.     Tenderness: There is no abdominal tenderness. There is no guarding or rebound.  Musculoskeletal:        General: No swelling or tenderness.  Skin:    General: Skin is warm and dry.  Neurological:     Mental Status: She is alert and oriented to person, place, and time.     Comments: Pupils equal round and reactive. No asymmetry of facial muscles. Altered sensation to light touch in the right upper extremity, otherwise sensation intact throughout all four extremities. Five out of five strength in all four extremities. Visual fields are grossly intact.  Psychiatric:        Behavior: Behavior normal.      ED Treatments / Results  Labs (all labs ordered are listed, but only abnormal results are displayed) Labs Reviewed  COMPREHENSIVE METABOLIC PANEL - Abnormal; Notable for the following components:      Result Value   Potassium 3.3 (*)    Glucose, Bld 104 (*)    All other components within normal limits  URINALYSIS, ROUTINE W REFLEX MICROSCOPIC - Abnormal; Notable for the following components:   Color, Urine STRAW (*)    Hgb urine dipstick MODERATE (*)    Bacteria, UA RARE (*)    All other components within normal limits  TROPONIN I  CBC WITH DIFFERENTIAL/PLATELET  HIV ANTIBODY (ROUTINE TESTING W REFLEX)  TSH  CK  SEDIMENTATION RATE  C-REACTIVE PROTEIN  I-STAT CREATININE, ED  CBG MONITORING, ED    EKG EKG  Interpretation  Date/Time:  Sunday April 20 2018 10:53:45 EST Ventricular Rate:  79 PR Interval:    QRS Duration: 68 QT Interval:  368 QTC Calculation: 422 R Axis:   56 Text Interpretation:  Sinus rhythm Confirmed by Quintella Reichert [phone]) on 04/20/2018 11:20:25 AM   Radiology Ct Angio Head W/cm &/or Wo Cm  Result Date: 04/20/2018 CLINICAL DATA:  Severe headache. Syncope today. Right upper extremity paresthesia. EXAM: CT ANGIOGRAPHY HEAD AND NECK TECHNIQUE: Multidetector CT imaging of the head and neck was performed using the standard protocol during bolus administration of intravenous contrast. Multiplanar CT image reconstructions and MIPs were obtained to evaluate the vascular anatomy. Carotid stenosis measurements (when applicable) are obtained utilizing NASCET criteria, using the distal internal carotid diameter as the denominator. CONTRAST:  115mL OMNIPAQUE IOHEXOL 350 MG/ML  SOLN COMPARISON:  Head CT 05/09/2004 FINDINGS: CT HEAD FINDINGS Brain: There is no evidence of acute infarct, intracranial hemorrhage, mass, midline shift, or extra-axial fluid collection. The ventricles and sulci are within normal limits for age. Vascular: No hyperdense vessel. Skull: No fracture or focal osseous lesion. Mild frontal scalp swelling. Sinuses: Paranasal sinuses and mastoid air cells are clear. Orbits: Unremarkable. Review of the MIP images confirms the above findings CTA NECK FINDINGS Aortic arch: Standard 3 vessel aortic arch with widely patent arch vessel origins. Right carotid system: Patent and smooth without evidence of stenosis or dissection. Left carotid system: Patent and smooth without evidence of stenosis or dissection. Vertebral arteries: Strongly dominant and widely patent right vertebral artery. Diffusely hypoplastic left vertebral artery with its V1 and proximal V2 segments obscured by venous contrast. The more distal left V2 and V3 segments are patent without evidence of significant focal  stenosis. Skeleton: No acute osseous abnormality or suspicious osseous lesion. Other neck: 1 cm low-density focus in the superior aspect of the left palatine tonsil. No hyperenhancing pharyngeal mass or significant parapharyngeal or retropharyngeal space inflammation is identified. Upper chest: Clear lung apices. Review of the MIP images confirms the above findings CTA HEAD FINDINGS Anterior circulation: The internal carotid arteries are widely patent from skull base to carotid termini. ACAs and MCAs are patent without evidence of proximal branch occlusion or significant proximal stenosis. No aneurysm is identified. Posterior circulation: The intracranial vertebral arteries are patent to the basilar. Patent PICAs and SCAs are seen bilaterally. The basilar artery is widely patent. Posterior communicating arteries are not clearly identified and may be diminutive or absent. PCAs are patent with branch vessel irregularity but no significant proximal stenosis. No aneurysm is identified. Venous sinuses: Patent. Anatomic variants: None. Delayed phase: No abnormal enhancement. Review of the MIP images confirms the above findings IMPRESSION: 1. Mild frontal scalp swelling without evidence of acute intracranial abnormality. 2. No large vessel occlusion or significant arterial stenosis in the head and neck. Congenitally hypoplastic left vertebral artery. 3. 1 cm hypodensity in the left palatine tonsil, favor a cyst over an abscess or neoplasm in this setting. Correlate with direct visualization. Electronically Signed   By: Logan Bores M.D.   On: 04/20/2018 12:27   Ct Angio Neck W And/or Wo Contrast  Result Date: 04/20/2018 CLINICAL DATA:  Severe headache. Syncope today. Right upper extremity paresthesia. EXAM: CT ANGIOGRAPHY HEAD AND NECK TECHNIQUE: Multidetector CT imaging of the head and neck was performed using the standard protocol during bolus administration of intravenous contrast. Multiplanar CT image  reconstructions and MIPs were obtained to evaluate the vascular anatomy. Carotid stenosis measurements (when applicable) are obtained utilizing NASCET criteria, using the distal internal carotid diameter as the denominator. CONTRAST:  113mL OMNIPAQUE IOHEXOL 350 MG/ML SOLN COMPARISON:  Head CT 05/09/2004 FINDINGS: CT HEAD FINDINGS Brain: There is no evidence of acute infarct, intracranial hemorrhage, mass, midline shift, or extra-axial fluid collection. The ventricles and sulci are within normal limits for age. Vascular: No hyperdense vessel. Skull: No fracture or focal osseous lesion. Mild frontal scalp swelling. Sinuses: Paranasal sinuses and mastoid air cells are clear. Orbits: Unremarkable. Review of the MIP images confirms the above findings CTA NECK FINDINGS Aortic arch: Standard 3 vessel aortic arch with widely patent arch vessel origins. Right carotid system: Patent and smooth without evidence of stenosis or dissection. Left carotid system: Patent and smooth without evidence of stenosis or dissection. Vertebral arteries: Strongly dominant and widely patent right vertebral artery. Diffusely hypoplastic  left vertebral artery with its V1 and proximal V2 segments obscured by venous contrast. The more distal left V2 and V3 segments are patent without evidence of significant focal stenosis. Skeleton: No acute osseous abnormality or suspicious osseous lesion. Other neck: 1 cm low-density focus in the superior aspect of the left palatine tonsil. No hyperenhancing pharyngeal mass or significant parapharyngeal or retropharyngeal space inflammation is identified. Upper chest: Clear lung apices. Review of the MIP images confirms the above findings CTA HEAD FINDINGS Anterior circulation: The internal carotid arteries are widely patent from skull base to carotid termini. ACAs and MCAs are patent without evidence of proximal branch occlusion or significant proximal stenosis. No aneurysm is identified. Posterior  circulation: The intracranial vertebral arteries are patent to the basilar. Patent PICAs and SCAs are seen bilaterally. The basilar artery is widely patent. Posterior communicating arteries are not clearly identified and may be diminutive or absent. PCAs are patent with branch vessel irregularity but no significant proximal stenosis. No aneurysm is identified. Venous sinuses: Patent. Anatomic variants: None. Delayed phase: No abnormal enhancement. Review of the MIP images confirms the above findings IMPRESSION: 1. Mild frontal scalp swelling without evidence of acute intracranial abnormality. 2. No large vessel occlusion or significant arterial stenosis in the head and neck. Congenitally hypoplastic left vertebral artery. 3. 1 cm hypodensity in the left palatine tonsil, favor a cyst over an abscess or neoplasm in this setting. Correlate with direct visualization. Electronically Signed   By: Logan Bores M.D.   On: 04/20/2018 12:27    Procedures Ultrasound ED Abd Date/Time: 04/20/2018 1:34 PM Performed by: Quintella Reichert, MD Authorized by: Quintella Reichert, MD   Procedure details:    Indications: abdominal pain     Assessment for:  AAA   Aorta:  Visualized   Images: archived   Study Limitations: bowel gas Vascular findings:    Aorta: aorta normal (< 3cm)     Intra-abdominal fluid: unidentified     (including critical care time)  Medications Ordered in ED Medications  sodium chloride (PF) 0.9 % injection (has no administration in time range)  sodium chloride flush (NS) 0.9 % injection 3 mL (has no administration in time range)  enoxaparin (LOVENOX) injection 40 mg (has no administration in time range)  0.9 %  sodium chloride infusion (has no administration in time range)  ondansetron (ZOFRAN) tablet 4 mg (has no administration in time range)    Or  ondansetron (ZOFRAN) injection 4 mg (has no administration in time range)  polyethylene glycol (MIRALAX / GLYCOLAX) packet 17 g (has no  administration in time range)  Influenza vac split quadrivalent PF (FLUARIX) injection 0.5 mL (has no administration in time range)  sodium chloride 0.9 % bolus 500 mL (0 mLs Intravenous Stopped 04/20/18 1245)  metoCLOPramide (REGLAN) injection 10 mg (10 mg Intravenous Given 04/20/18 1350)  iohexol (OMNIPAQUE) 350 MG/ML injection 100 mL (100 mLs Intravenous Contrast Given 04/20/18 1154)  prochlorperazine (COMPAZINE) injection 10 mg (10 mg Intravenous Given 04/20/18 1347)  diphenhydrAMINE (BENADRYL) injection 12.5 mg (12.5 mg Intravenous Given 04/20/18 1348)     Initial Impression / Assessment and Plan / ED Course  I have reviewed the triage vital signs and the nursing notes.  Pertinent labs & imaging results that were available during my care of the patient were reviewed by me and considered in my medical decision making (see chart for details).        Patient here for evaluation following syncopal event, five days of recent headaches. She  is neurologically intact on examination. Headache is only partially improved following treatment with headache cocktail. CTA is negative for aneurysm. Given syncope plan to admit for observation. Hospitalist consulted for admission.  Final Clinical Impressions(s) / ED Diagnoses   Final diagnoses:  Syncope and collapse    ED Discharge Orders    None       Quintella Reichert, MD 04/20/18 1557

## 2018-04-20 NOTE — ED Notes (Signed)
Report given to Belva, [place]

## 2018-04-20 NOTE — ED Notes (Signed)
Attending provider at bedside

## 2018-04-20 NOTE — ED Notes (Signed)
ED TO INPATIENT HANDOFF REPORT  Name/Age/Gender Ashley Bruce 53 y.o. female  Code Status Code Status History    Date Active Date Inactive Code Status Order ID Comments User Context   12/07/2013 1859 12/09/2013 1443 Full Code [id number]  Coralie Keens, MD Inpatient      Home/SNF/Other Home  Chief Complaint syncope; hypertension  Level of Care/Admitting Diagnosis ED Disposition    ED Disposition Condition [facility] Area: [facility] [[id number]]  Level of Care: Telemetry [5]  Admit to tele based on following criteria: Eval of Syncope  Diagnosis: Syncope [509326]  Admitting Physician: Lavina Hamman [[id number]]  Attending Physician: Lavina Hamman [[id number]]  PT Class (Do Not Modify): Observation [104]  PT Acc Code (Do Not Modify): Observation [10022]       Medical History Past Medical History:  Diagnosis Date  . Anemia   . Fibroids   . Hypertension     Allergies Allergies  Allergen Reactions  . Tylenol [Acetaminophen] Itching and Other (See Comments)    Blood pressure rises    IV Location/Drains/Wounds Patient Lines/Drains/Airways Status   Active Line/Drains/Airways    Name:   Placement date:   Placement time:   Site:   Days:   Peripheral IV 04/20/18 Left Antecubital   04/20/18    1020    Antecubital   less than 1          Labs/Imaging Results for orders placed or performed during the hospital encounter of 04/20/18 (from the past 48 hour(s))  Comprehensive metabolic panel     Status: Abnormal   Collection Time: 04/20/18 11:26 AM  Result Value Ref Range   Sodium 139 135 - 145 mmol/L   Potassium 3.3 (L) 3.5 - 5.1 mmol/L   Chloride 106 98 - 111 mmol/L   CO2 25 22 - 32 mmol/L   Glucose, Bld 104 (H) 70 - 99 mg/dL   BUN 10 6 - 20 mg/dL   Creatinine, Ser 0.60 0.44 - 1.00 mg/dL   Calcium 9.0 8.9 - 10.3 mg/dL   Total Protein 7.4 6.5 - 8.1 g/dL   Albumin 4.0 3.5 - 5.0 g/dL   AST 24 15 - 41 U/L   ALT 21 0 - 44 U/L    Alkaline Phosphatase 58 38 - 126 U/L   Total Bilirubin 0.6 0.3 - 1.2 mg/dL   GFR calc non Af Amer >60 >60 mL/min   GFR calc Af Amer >60 >60 mL/min   Anion gap 8 5 - 15    Comment: Performed at [facility], [place] [address] [place], [place] [address]  Troponin I - Once     Status: None   Collection Time: 04/20/18 11:26 AM  Result Value Ref Range   Troponin I <0.03 <0.03 ng/mL    Comment: Performed at [facility], [place] [address], [place], [place] [address]  CBC with Differential     Status: None   Collection Time: 04/20/18 11:26 AM  Result Value Ref Range   WBC 9.1 4.0 - 10.5 K/uL   RBC 4.78 3.87 - 5.11 MIL/uL   Hemoglobin 14.4 12.0 - 15.0 g/dL   HCT 42.0 36.0 - 46.0 %   MCV 87.9 80.0 - 100.0 fL   MCH 30.1 26.0 - 34.0 pg   MCHC 34.3 30.0 - 36.0 g/dL   RDW 13.3 11.5 - 15.5 %   Platelets 274 150 - 400 K/uL   nRBC 0.0 0.0 - 0.2 %  Neutrophils Relative % 76 %   Neutro Abs 6.9 1.7 - 7.7 K/uL   Lymphocytes Relative 17 %   Lymphs Abs 1.6 0.7 - 4.0 K/uL   Monocytes Relative 6 %   Monocytes Absolute 0.6 0.1 - 1.0 K/uL   Eosinophils Relative 1 %   Eosinophils Absolute 0.1 0.0 - 0.5 K/uL   Basophils Relative 0 %   Basophils Absolute 0.0 0.0 - 0.1 K/uL   Immature Granulocytes 0 %   Abs Immature Granulocytes 0.04 0.00 - 0.07 K/uL    Comment: Performed at [facility], [place] [address], [place], [place] [address]  I-stat Creatinine, ED     Status: None   Collection Time: 04/20/18 11:37 AM  Result Value Ref Range   Creatinine, Ser 0.50 0.44 - 1.00 mg/dL  CBG monitoring, ED     Status: None   Collection Time: 04/20/18 11:41 AM  Result Value Ref Range   Glucose-Capillary 87 70 - 99 mg/dL  Urinalysis, Routine w reflex microscopic     Status: Abnormal   Collection Time: 04/20/18 12:17 PM  Result Value Ref Range   Color, Urine STRAW (A) YELLOW   APPearance CLEAR CLEAR   Specific Gravity, Urine 1.019 1.005 - 1.030   pH 7.0 5.0 - 8.0    Glucose, UA NEGATIVE NEGATIVE mg/dL   Hgb urine dipstick MODERATE (A) NEGATIVE   Bilirubin Urine NEGATIVE NEGATIVE   Ketones, ur NEGATIVE NEGATIVE mg/dL   Protein, ur NEGATIVE NEGATIVE mg/dL   Nitrite NEGATIVE NEGATIVE   Leukocytes,Ua NEGATIVE NEGATIVE   RBC / HPF 0-5 0 - 5 RBC/hpf   WBC, UA 0-5 0 - 5 WBC/hpf   Bacteria, UA RARE (A) NONE SEEN   Squamous Epithelial / LPF 0-5 0 - 5   Mucus PRESENT     Comment: Performed at [facility], [place] [address] [place], [place] [address]   Ct Angio Head W/cm &/or Wo Cm  Result Date: 04/20/2018 CLINICAL DATA:  Severe headache. Syncope today. Right upper extremity paresthesia. EXAM: CT ANGIOGRAPHY HEAD AND NECK TECHNIQUE: Multidetector CT imaging of the head and neck was performed using the standard protocol during bolus administration of intravenous contrast. Multiplanar CT image reconstructions and MIPs were obtained to evaluate the vascular anatomy. Carotid stenosis measurements (when applicable) are obtained utilizing NASCET criteria, using the distal internal carotid diameter as the denominator. CONTRAST:  178mL OMNIPAQUE IOHEXOL 350 MG/ML SOLN COMPARISON:  Head CT 05/09/2004 FINDINGS: CT HEAD FINDINGS Brain: There is no evidence of acute infarct, intracranial hemorrhage, mass, midline shift, or extra-axial fluid collection. The ventricles and sulci are within normal limits for age. Vascular: No hyperdense vessel. Skull: No fracture or focal osseous lesion. Mild frontal scalp swelling. Sinuses: Paranasal sinuses and mastoid air cells are clear. Orbits: Unremarkable. Review of the MIP images confirms the above findings CTA NECK FINDINGS Aortic arch: Standard 3 vessel aortic arch with widely patent arch vessel origins. Right carotid system: Patent and smooth without evidence of stenosis or dissection. Left carotid system: Patent and smooth without evidence of stenosis or dissection. Vertebral arteries: Strongly dominant and widely patent  right vertebral artery. Diffusely hypoplastic left vertebral artery with its V1 and proximal V2 segments obscured by venous contrast. The more distal left V2 and V3 segments are patent without evidence of significant focal stenosis. Skeleton: No acute osseous abnormality or suspicious osseous lesion. Other neck: 1 cm low-density focus in the superior aspect of the left palatine tonsil. No hyperenhancing pharyngeal mass or significant  parapharyngeal or retropharyngeal space inflammation is identified. Upper chest: Clear lung apices. Review of the MIP images confirms the above findings CTA HEAD FINDINGS Anterior circulation: The internal carotid arteries are widely patent from skull base to carotid termini. ACAs and MCAs are patent without evidence of proximal branch occlusion or significant proximal stenosis. No aneurysm is identified. Posterior circulation: The intracranial vertebral arteries are patent to the basilar. Patent PICAs and SCAs are seen bilaterally. The basilar artery is widely patent. Posterior communicating arteries are not clearly identified and may be diminutive or absent. PCAs are patent with branch vessel irregularity but no significant proximal stenosis. No aneurysm is identified. Venous sinuses: Patent. Anatomic variants: None. Delayed phase: No abnormal enhancement. Review of the MIP images confirms the above findings IMPRESSION: 1. Mild frontal scalp swelling without evidence of acute intracranial abnormality. 2. No large vessel occlusion or significant arterial stenosis in the head and neck. Congenitally hypoplastic left vertebral artery. 3. 1 cm hypodensity in the left palatine tonsil, favor a cyst over an abscess or neoplasm in this setting. Correlate with direct visualization. Electronically Signed   By: Logan Bores M.D.   On: 04/20/2018 12:27   Ct Angio Neck W And/or Wo Contrast  Result Date: 04/20/2018 CLINICAL DATA:  Severe headache. Syncope today. Right upper extremity paresthesia.  EXAM: CT ANGIOGRAPHY HEAD AND NECK TECHNIQUE: Multidetector CT imaging of the head and neck was performed using the standard protocol during bolus administration of intravenous contrast. Multiplanar CT image reconstructions and MIPs were obtained to evaluate the vascular anatomy. Carotid stenosis measurements (when applicable) are obtained utilizing NASCET criteria, using the distal internal carotid diameter as the denominator. CONTRAST:  177mL OMNIPAQUE IOHEXOL 350 MG/ML SOLN COMPARISON:  Head CT 05/09/2004 FINDINGS: CT HEAD FINDINGS Brain: There is no evidence of acute infarct, intracranial hemorrhage, mass, midline shift, or extra-axial fluid collection. The ventricles and sulci are within normal limits for age. Vascular: No hyperdense vessel. Skull: No fracture or focal osseous lesion. Mild frontal scalp swelling. Sinuses: Paranasal sinuses and mastoid air cells are clear. Orbits: Unremarkable. Review of the MIP images confirms the above findings CTA NECK FINDINGS Aortic arch: Standard 3 vessel aortic arch with widely patent arch vessel origins. Right carotid system: Patent and smooth without evidence of stenosis or dissection. Left carotid system: Patent and smooth without evidence of stenosis or dissection. Vertebral arteries: Strongly dominant and widely patent right vertebral artery. Diffusely hypoplastic left vertebral artery with its V1 and proximal V2 segments obscured by venous contrast. The more distal left V2 and V3 segments are patent without evidence of significant focal stenosis. Skeleton: No acute osseous abnormality or suspicious osseous lesion. Other neck: 1 cm low-density focus in the superior aspect of the left palatine tonsil. No hyperenhancing pharyngeal mass or significant parapharyngeal or retropharyngeal space inflammation is identified. Upper chest: Clear lung apices. Review of the MIP images confirms the above findings CTA HEAD FINDINGS Anterior circulation: The internal carotid arteries  are widely patent from skull base to carotid termini. ACAs and MCAs are patent without evidence of proximal branch occlusion or significant proximal stenosis. No aneurysm is identified. Posterior circulation: The intracranial vertebral arteries are patent to the basilar. Patent PICAs and SCAs are seen bilaterally. The basilar artery is widely patent. Posterior communicating arteries are not clearly identified and may be diminutive or absent. PCAs are patent with branch vessel irregularity but no significant proximal stenosis. No aneurysm is identified. Venous sinuses: Patent. Anatomic variants: None. Delayed phase: No abnormal enhancement. Review of  the MIP images confirms the above findings IMPRESSION: 1. Mild frontal scalp swelling without evidence of acute intracranial abnormality. 2. No large vessel occlusion or significant arterial stenosis in the head and neck. Congenitally hypoplastic left vertebral artery. 3. 1 cm hypodensity in the left palatine tonsil, favor a cyst over an abscess or neoplasm in this setting. Correlate with direct visualization. Electronically Signed   By: Logan Bores M.D.   On: 04/20/2018 12:27    Pending Labs Unresulted Labs (From admission, onward)   None      Vitals/Pain Today's Vitals   04/20/18 1123 04/20/18 1130 04/20/18 1345 04/20/18 1409  BP: 140/88 (!) 148/82  (!) 143/90  Pulse: 71 63 79 90  Resp: 16 12 14 16   Temp: 98.2 F (36.8 C)   98.2 F (36.8 C)  TempSrc: Oral   Oral  SpO2: 98% 100% 100% 100%  PainSc:    2     Isolation Precautions No active isolations  Medications Medications  sodium chloride (PF) 0.9 % injection (has no administration in time range)  sodium chloride 0.9 % bolus 500 mL (0 mLs Intravenous Stopped 04/20/18 1245)  metoCLOPramide (REGLAN) injection 10 mg (10 mg Intravenous Given 04/20/18 1350)  iohexol (OMNIPAQUE) 350 MG/ML injection 100 mL (100 mLs Intravenous Contrast Given 04/20/18 1154)  prochlorperazine (COMPAZINE) injection 10  mg (10 mg Intravenous Given 04/20/18 1347)  diphenhydrAMINE (BENADRYL) injection 12.5 mg (12.5 mg Intravenous Given 04/20/18 1348)    Mobility walks

## 2018-04-21 ENCOUNTER — Observation Stay (HOSPITAL_BASED_OUTPATIENT_CLINIC_OR_DEPARTMENT_OTHER): Payer: Self-pay

## 2018-04-21 ENCOUNTER — Observation Stay (HOSPITAL_COMMUNITY): Payer: Self-pay

## 2018-04-21 DIAGNOSIS — R55 Syncope and collapse: Secondary | ICD-10-CM

## 2018-04-21 LAB — ECHOCARDIOGRAM COMPLETE
Height: 59 in
Weight: 2084.67 oz

## 2018-04-21 LAB — GLUCOSE, CAPILLARY: Glucose-Capillary: 73 mg/dL (ref 70–99)

## 2018-04-21 LAB — HIV ANTIBODY (ROUTINE TESTING W REFLEX): HIV Screen 4th Generation wRfx: NONREACTIVE

## 2018-04-21 MED ORDER — IBUPROFEN 200 MG PO TABS
600.0000 mg | ORAL_TABLET | Freq: Once | ORAL | Status: DC
  Filled 2018-04-21: qty 3

## 2018-04-21 MED ORDER — LIP MEDEX EX OINT: TOPICAL_OINTMENT | CUTANEOUS | Status: DC | PRN

## 2018-04-21 MED ORDER — KETOROLAC TROMETHAMINE 30 MG/ML IJ SOLN
30.0000 mg | Freq: Once | INTRAMUSCULAR | Status: AC
  Administered 2018-04-21: 30 mg via INTRAVENOUS
  Filled 2018-04-21: qty 1

## 2018-04-21 NOTE — Progress Notes (Signed)
Tech came into room and patient was dressed and sitting on side of bed. Per RN family is gone to get the car as the pt is being discharged. RN is checking with MD to see if it still needs to be done. Will check back after next pt.

## 2018-04-21 NOTE — Progress Notes (Signed)
Confirm from Dr. Cyndia Skeeters about EEG, he gave order to discontinue EEG and discharge patient.

## 2018-04-21 NOTE — Discharge Summary (Signed)
Physician Discharge Summary  Ashley Bruce YPP:[id number] DOB: 03/21/1965 DOA: 04/20/2018  PCP: Patient, No Pcp Per  Admit date: 04/20/2018 Discharge date: 04/21/2018  Admitted From: Home Disposition: Home  Recommendations for Outpatient Follow-up:  1. Follow up with PCP in 1-2 weeks 2. Please obtain BMP/CBC in one week 3. Please follow up on the following pending results:  Home Health: None Equipment/Devices: None  Discharge Condition: Stable CODE STATUS: Full code  Hospital Course: 53 year old female with history of hypertension who was admitted for unresponsive episode the morning of admission.  Patient felt some abdominal pain and diaphoresis before the incident.  She was unconscious for about a minute.  No notable seizure-like activity per family.  Had brief episode of tingling and numbness in her arm that has resolved.  No associated nausea, vomiting or recent illness.  CTA head and neck were negative for any acute abnormality.  She had some left upper extremity compared to right due to underlying chronic shoulder pain.  Chest and shoulder x-rays were not impressive.  Troponin and EKG not impressive.  No event on telemetry.  MRI brain negative for acute intracranial process.  Echocardiogram was basically normal as well.  Evaluated by physical therapy and SLP and no need was identified. Discharged home in stable condition.  Concern for concussion.  Recommended complete physical and mental rest at least for a week.  Patient to follow-up with PCP in 1 to 2 weeks.  Discharge Diagnoses:  Active Problems:   Syncope  Syncope: Likely vasovagal based on patient's description and prodromes.  Work-up including CTA head and neck, troponin, EKG, chest x-ray, echocardiogram and MRI negative.  And without focal neuro deficit.  Concussion: Mild headache.  No associated nausea vomiting.  Recommended complete physical and mental rest rest for 1 week.  Hypertension: Normotensive.  Discharged on  home medications.  Discharge Instructions  Discharge Instructions    Call MD for:  difficulty breathing, headache or visual disturbances   Complete by:  As directed    Call MD for:  extreme fatigue   Complete by:  As directed    Call MD for:  persistant dizziness or light-headedness   Complete by:  As directed    Call MD for:  persistant nausea and vomiting   Complete by:  As directed    Call MD for:  severe uncontrolled pain   Complete by:  As directed    Call MD for:  temperature >100.4   Complete by:  As directed    Diet - low sodium heart healthy   Complete by:  As directed    Discharge instructions   Complete by:  As directed    It has been a pleasure taking care of you! You were admitted because you passed out.  It is unclear why you passed out.  The tests we have done so far did not show any problem related to your heart.  The imaging of your head did not show stroke or other major finding.  You may have what we call a concussion as a result of fall and hitting your head.  We recommend total rest, physical and mental, for about a week or until your headache and your symptoms resolve completely.  Follow-up with your primary care doctor in 1 to 2 weeks.  Keep yourself hydrated at all times.  Please seek immediate care if you have persistent nausea, vomiting, severe headache or other symptoms concerning to you.  Once you are discharged, your primary care physician will  handle any further medical issues. Please note that NO REFILLS for any discharge medications will be authorized once you are discharged, as it is imperative that you return to your primary care physician (or establish a relationship with a primary care physician if you do not have one) for your aftercare needs so that they can reassess your need for medications and monitor your lab values. Take care,   Increase activity slowly   Complete by:  As directed      Allergies as of 04/21/2018      Reactions   Tylenol  [acetaminophen] Itching, Other (See Comments)   Blood pressure rises      Medication List    TAKE these medications   IBUPROFEN PO Take by mouth.   lisinopril 5 MG tablet Commonly known as:  PRINIVIL,ZESTRIL Take 5 mg by mouth daily.      Follow-up Information    Primary Care at [facility]. Schedule an appointment as soon as possible for a visit in 1 week(s).   Specialty:  Family Medicine Contact information: [address] [place] [phone]          Consultations:  None  Procedures/Studies: 2D Echo:   1. The left ventricle has hyperdynamic systolic function, with an ejection fraction of >65%. The cavity size was normal. mild basal septal hypertrophy. Left ventricular diastolic parameters were normal.  2. The right ventricle has normal systolic function. The cavity was normal. There is no increase in right ventricular wall thickness.  3. The mitral valve is normal in structure.  4. The tricuspid valve is normal in structure.  5. The aortic valve was not well visualized.  6. The pulmonic valve was normal in structure.  Ct Angio Head W/cm &/or Wo Cm  Result Date: 04/20/2018 CLINICAL DATA:  Severe headache. Syncope today. Right upper extremity paresthesia. EXAM: CT ANGIOGRAPHY HEAD AND NECK TECHNIQUE: Multidetector CT imaging of the head and neck was performed using the standard protocol during bolus administration of intravenous contrast. Multiplanar CT image reconstructions and MIPs were obtained to evaluate the vascular anatomy. Carotid stenosis measurements (when applicable) are obtained utilizing NASCET criteria, using the distal internal carotid diameter as the denominator. CONTRAST:  196mL OMNIPAQUE IOHEXOL 350 MG/ML SOLN COMPARISON:  Head CT 05/09/2004 FINDINGS: CT HEAD FINDINGS Brain: There is no evidence of acute infarct, intracranial hemorrhage, mass, midline shift, or extra-axial fluid collection. The ventricles and sulci are within normal  limits for age. Vascular: No hyperdense vessel. Skull: No fracture or focal osseous lesion. Mild frontal scalp swelling. Sinuses: Paranasal sinuses and mastoid air cells are clear. Orbits: Unremarkable. Review of the MIP images confirms the above findings CTA NECK FINDINGS Aortic arch: Standard 3 vessel aortic arch with widely patent arch vessel origins. Right carotid system: Patent and smooth without evidence of stenosis or dissection. Left carotid system: Patent and smooth without evidence of stenosis or dissection. Vertebral arteries: Strongly dominant and widely patent right vertebral artery. Diffusely hypoplastic left vertebral artery with its V1 and proximal V2 segments obscured by venous contrast. The more distal left V2 and V3 segments are patent without evidence of significant focal stenosis. Skeleton: No acute osseous abnormality or suspicious osseous lesion. Other neck: 1 cm low-density focus in the superior aspect of the left palatine tonsil. No hyperenhancing pharyngeal mass or significant parapharyngeal or retropharyngeal space inflammation is identified. Upper chest: Clear lung apices. Review of the MIP images confirms the above findings CTA HEAD FINDINGS Anterior circulation: The internal carotid arteries are widely  patent from skull base to carotid termini. ACAs and MCAs are patent without evidence of proximal branch occlusion or significant proximal stenosis. No aneurysm is identified. Posterior circulation: The intracranial vertebral arteries are patent to the basilar. Patent PICAs and SCAs are seen bilaterally. The basilar artery is widely patent. Posterior communicating arteries are not clearly identified and may be diminutive or absent. PCAs are patent with branch vessel irregularity but no significant proximal stenosis. No aneurysm is identified. Venous sinuses: Patent. Anatomic variants: None. Delayed phase: No abnormal enhancement. Review of the MIP images confirms the above findings  IMPRESSION: 1. Mild frontal scalp swelling without evidence of acute intracranial abnormality. 2. No large vessel occlusion or significant arterial stenosis in the head and neck. Congenitally hypoplastic left vertebral artery. 3. 1 cm hypodensity in the left palatine tonsil, favor a cyst over an abscess or neoplasm in this setting. Correlate with direct visualization. Electronically Signed   By: Logan Bores M.D.   On: 04/20/2018 12:27   X-ray Chest Pa And Lateral  Result Date: 04/20/2018 CLINICAL DATA:  Syncope EXAM: CHEST - 2 VIEW COMPARISON:  12/07/2013 FINDINGS: The heart size and mediastinal contours are within normal limits. Both lungs are clear. The visualized skeletal structures are unremarkable. IMPRESSION: No active cardiopulmonary disease. Electronically Signed   By: Kathreen Devoid   On: 04/20/2018 16:47   Ct Angio Neck W And/or Wo Contrast  Result Date: 04/20/2018 CLINICAL DATA:  Severe headache. Syncope today. Right upper extremity paresthesia. EXAM: CT ANGIOGRAPHY HEAD AND NECK TECHNIQUE: Multidetector CT imaging of the head and neck was performed using the standard protocol during bolus administration of intravenous contrast. Multiplanar CT image reconstructions and MIPs were obtained to evaluate the vascular anatomy. Carotid stenosis measurements (when applicable) are obtained utilizing NASCET criteria, using the distal internal carotid diameter as the denominator. CONTRAST:  153mL OMNIPAQUE IOHEXOL 350 MG/ML SOLN COMPARISON:  Head CT 05/09/2004 FINDINGS: CT HEAD FINDINGS Brain: There is no evidence of acute infarct, intracranial hemorrhage, mass, midline shift, or extra-axial fluid collection. The ventricles and sulci are within normal limits for age. Vascular: No hyperdense vessel. Skull: No fracture or focal osseous lesion. Mild frontal scalp swelling. Sinuses: Paranasal sinuses and mastoid air cells are clear. Orbits: Unremarkable. Review of the MIP images confirms the above findings CTA NECK  FINDINGS Aortic arch: Standard 3 vessel aortic arch with widely patent arch vessel origins. Right carotid system: Patent and smooth without evidence of stenosis or dissection. Left carotid system: Patent and smooth without evidence of stenosis or dissection. Vertebral arteries: Strongly dominant and widely patent right vertebral artery. Diffusely hypoplastic left vertebral artery with its V1 and proximal V2 segments obscured by venous contrast. The more distal left V2 and V3 segments are patent without evidence of significant focal stenosis. Skeleton: No acute osseous abnormality or suspicious osseous lesion. Other neck: 1 cm low-density focus in the superior aspect of the left palatine tonsil. No hyperenhancing pharyngeal mass or significant parapharyngeal or retropharyngeal space inflammation is identified. Upper chest: Clear lung apices. Review of the MIP images confirms the above findings CTA HEAD FINDINGS Anterior circulation: The internal carotid arteries are widely patent from skull base to carotid termini. ACAs and MCAs are patent without evidence of proximal branch occlusion or significant proximal stenosis. No aneurysm is identified. Posterior circulation: The intracranial vertebral arteries are patent to the basilar. Patent PICAs and SCAs are seen bilaterally. The basilar artery is widely patent. Posterior communicating arteries are not clearly identified and may be diminutive or  absent. PCAs are patent with branch vessel irregularity but no significant proximal stenosis. No aneurysm is identified. Venous sinuses: Patent. Anatomic variants: None. Delayed phase: No abnormal enhancement. Review of the MIP images confirms the above findings IMPRESSION: 1. Mild frontal scalp swelling without evidence of acute intracranial abnormality. 2. No large vessel occlusion or significant arterial stenosis in the head and neck. Congenitally hypoplastic left vertebral artery. 3. 1 cm hypodensity in the left palatine  tonsil, favor a cyst over an abscess or neoplasm in this setting. Correlate with direct visualization. Electronically Signed   By: Logan Bores M.D.   On: 04/20/2018 12:27   Mr Brain Wo Contrast  Result Date: 04/21/2018 CLINICAL DATA:  Syncope EXAM: MRI HEAD WITHOUT CONTRAST TECHNIQUE: Multiplanar, multiecho pulse sequences of the brain and surrounding structures were obtained without intravenous contrast. COMPARISON:  CT head 04/20/2018 FINDINGS: Brain: No acute infarction, hemorrhage, hydrocephalus, extra-axial collection or mass lesion. Vascular: No flow void for the distal left vertebral artery which is hypoplastic on recent CTA. Normal flow voids in the right vertebral artery and basilar as well as in the anterior circulation. Skull and upper cervical spine: Negative Sinuses/Orbits: Negative Other: None IMPRESSION: Negative MRI head Electronically Signed   By: Franchot Gallo M.D.   On: 04/21/2018 12:24   Dg Shoulder Left  Result Date: 04/20/2018 CLINICAL DATA:  Left shoulder pain EXAM: LEFT SHOULDER - 2+ VIEW COMPARISON:  None. FINDINGS: No fracture or dislocation is seen. The joint spaces are preserved. The visualized soft tissues are unremarkable. Visualized left lung is clear. IMPRESSION: Negative. Electronically Signed   By: Julian Hy M.D.   On: 04/20/2018 16:50      Subjective: No major events overnight of this morning.  Complains of mild headache over frontal aspect.  Denies nausea, vomiting, visual change, focal numbness, tingling or weakness.  Denies chest pain, dyspnea or palpitation.  Has history of seizure.  No history of cardiac disease.  Discharge Exam: Vitals:   04/21/18 0453 04/21/18 1017  BP: 121/67 (!) 151/94  Pulse: 80 80  Resp: 16 16  Temp: 99.3 F (37.4 C) 98.4 F (36.9 C)  SpO2: 100% 98%    GENERAL: Appears well. No acute distress.  HEENT: MMM.  Vision and Hearing grossly intact.  Small bruise over frontal head. NECK: Supple.  No JVD.  LUNGS:  No IWOB.  Good air movement. CTAB.  HEART:  RRR. Heart sounds normal.  ABD: Bowel sounds present. Soft. Non tender.  EXT:   no edema bilaterally.  SKIN: no apparent skin lesion.  NEURO: Awake, alert and oriented x4.  Cranial, motor and sensory nerves intact.  Patellar reflex symmetric.  Pronator drift. PSYCH: Calm. Normal affect.  The results of significant diagnostics from this hospitalization (including imaging, microbiology, ancillary and laboratory) are listed below for reference.     Microbiology: No results found for this or any previous visit (from the past 240 hour(s)).   Labs: BNP (last 3 results) No results for input(s): BNP in the last 8760 hours. Basic Metabolic Panel: Recent Labs  Lab 04/20/18 1126 04/20/18 1137  NA 139  --   K 3.3*  --   CL 106  --   CO2 25  --   GLUCOSE 104*  --   BUN 10  --   CREATININE 0.60 0.50  CALCIUM 9.0  --    Liver Function Tests: Recent Labs  Lab 04/20/18 1126  AST 24  ALT 21  ALKPHOS 58  BILITOT 0.6  PROT  7.4  ALBUMIN 4.0   No results for input(s): LIPASE, AMYLASE in the last 168 hours. No results for input(s): AMMONIA in the last 168 hours. CBC: Recent Labs  Lab 04/20/18 1126  WBC 9.1  NEUTROABS 6.9  HGB 14.4  HCT 42.0  MCV 87.9  PLT 274   Cardiac Enzymes: Recent Labs  Lab 04/20/18 1126 04/20/18 1531  CKTOTAL  --  84  TROPONINI <0.03  --    BNP: Invalid input(s): POCBNP CBG: Recent Labs  Lab 04/20/18 1141 04/21/18 0811  GLUCAP 87 73   D-Dimer No results for input(s): DDIMER in the last 72 hours. Hgb A1c No results for input(s): HGBA1C in the last 72 hours. Lipid Profile No results for input(s): CHOL, HDL, LDLCALC, TRIG, CHOLHDL, LDLDIRECT in the last 72 hours. Thyroid function studies Recent Labs    04/20/18 1531  TSH 2.236   Anemia work up No results for input(s): VITAMINB12, FOLATE, FERRITIN, TIBC, IRON, RETICCTPCT in the last 72 hours. Urinalysis    Component Value Date/Time   COLORURINE STRAW  (A) 04/20/2018 1217   APPEARANCEUR CLEAR 04/20/2018 1217   LABSPEC 1.019 04/20/2018 1217   PHURINE 7.0 04/20/2018 1217   GLUCOSEU NEGATIVE 04/20/2018 1217   HGBUR MODERATE (A) 04/20/2018 1217   BILIRUBINUR NEGATIVE 04/20/2018 1217   KETONESUR NEGATIVE 04/20/2018 1217   PROTEINUR NEGATIVE 04/20/2018 1217   UROBILINOGEN 0.2 01/01/2014 2108   NITRITE NEGATIVE 04/20/2018 1217   LEUKOCYTESUR NEGATIVE 04/20/2018 1217   Sepsis Labs Invalid input(s): PROCALCITONIN,  WBC,  LACTICIDVEN   Time coordinating discharge: 35 minutes  SIGNED:  Mercy Riding, MD  [facility] 04/21/2018, 1:26 PM Pager [phone]  If 7PM-7AM, please contact night-coverage [email or web address] Password [email or web address]

## 2018-04-21 NOTE — Progress Notes (Signed)
Patient discharged home with daughter, discharge instructions given and explained to patient/daughter, they verbalized understanding, No pressure injury  noted skin intact. Accompanied home by daughter.

## 2018-04-21 NOTE — Evaluation (Signed)
Physical Therapy One Time Evaluation Patient Details Name: Ashley Bruce MRN: [id number] DOB: 06-May-1965 Today's Date: 04/21/2018   History of Present Illness  Pt is a 53 year old female admitted after syncope episode at home.  PMHx HTN  Clinical Impression  Patient evaluated by Physical Therapy with no further acute PT needs identified. All education has been completed and the patient has no further questions.  Pt able to ambulate in hallway and denies any symptoms.  No further follow-up Physical Therapy or equipment needs. PT is signing off. Thank you for this referral.     Follow Up Recommendations No PT follow up    Equipment Recommendations  None recommended by PT    Recommendations for Other Services       Precautions / Restrictions Precautions Precautions: Fall      Mobility  Bed Mobility Overal bed mobility: Modified Independent                Transfers Overall transfer level: Modified independent                  Ambulation/Gait Ambulation/Gait assistance: Modified independent (Device/Increase time) Gait Distance (Feet): 400 Feet Assistive device: None Gait Pattern/deviations: WFL(Within Functional Limits) Gait velocity: except slow cautious pace   General Gait Details: denies any symptoms  Stairs            Wheelchair Mobility    Modified Rankin (Stroke Patients Only)       Balance Overall balance assessment: No apparent balance deficits (not formally assessed)                                           Pertinent Vitals/Pain Pain Assessment: No/denies pain Faces Pain Scale: Hurts a little bit Pain Location: head - RN provided ice pack Pain Intervention(s): Monitored during session    Home Living Family/patient expects to be discharged to:: Private residence Living Arrangements: Children Available Help at Discharge: Family         Home Layout: One level Home Equipment: None      Prior Function  Level of Independence: Independent               Hand Dominance        Extremity/Trunk Assessment        Lower Extremity Assessment Lower Extremity Assessment: Overall WFL for tasks assessed(denies weakness, numbness, tingling)    Cervical / Trunk Assessment Cervical / Trunk Assessment: Normal  Communication   Communication: Prefers language other than English  Cognition Arousal/Alertness: Awake/alert Behavior During Therapy: WFL for tasks assessed/performed;Flat affect Overall Cognitive Status: Within Functional Limits for tasks assessed                                        General Comments      Exercises     Assessment/Plan    PT Assessment Patent does not need any further PT services  PT Problem List         PT Treatment Interventions      PT Goals (Current goals can be found in the Care Plan section)  Acute Rehab PT Goals PT Goal Formulation: All assessment and education complete, DC therapy    Frequency     Barriers to discharge        Co-evaluation  AM-PAC PT "6 Clicks" Mobility  Outcome Measure Help needed turning from your back to your side while in a flat bed without using bedrails?: None Help needed moving from lying on your back to sitting on the side of a flat bed without using bedrails?: None Help needed moving to and from a bed to a chair (including a wheelchair)?: None Help needed standing up from a chair using your arms (e.g., wheelchair or bedside chair)?: None Help needed to walk in hospital room?: A Little Help needed climbing 3-5 steps with a railing? : A Little 6 Click Score: 22    End of Session   Activity Tolerance: Patient tolerated treatment well Patient left: in bed;with family/visitor present Nurse Communication: Mobility status PT Visit Diagnosis: Difficulty in walking, not elsewhere classified (R26.2)    Time: 4585-9292 PT Time Calculation (min) (ACUTE ONLY): 10  min   Charges:   PT Evaluation $PT Eval Low Complexity: Amelia, PT, DPT Acute Rehabilitation Services Office: [phone] Pager: [phone]  Trena Platt 04/21/2018, 2:25 PM

## 2018-04-21 NOTE — Progress Notes (Signed)
  Echocardiogram 2D Echocardiogram has been performed.  Ashley Bruce M 04/21/2018, 12:37 PM

## 2018-04-21 NOTE — Evaluation (Signed)
Clinical/Bedside Swallow Evaluation Patient Details  Name: Ashley Bruce MRN: [id number] Date of Birth: October 04, 1965  Today's Date: 04/21/2018 Time: SLP Start Time (ACUTE ONLY): 8366 SLP Stop Time (ACUTE ONLY): 1056 SLP Time Calculation (min) (ACUTE ONLY): 21 min  Past Medical History:  Past Medical History:  Diagnosis Date  . Anemia   . Fibroids   . Hypertension    Past Surgical History:  Past Surgical History:  Procedure Laterality Date  . CESAREAN SECTION    . CHOLECYSTECTOMY  12/07/2013  . CHOLECYSTECTOMY N/A 12/07/2013   Procedure: LAPAROSCOPIC CHOLECYSTECTOMY WITH INTRAOPERATIVE CHOLANGIOGRAM;  Surgeon: Coralie Keens, MD;  Location: [place];  Service: General;  Laterality: N/A;   HPI:  Ashley Bruce is a 53 yo who was found on the floor by her daughter- found to have abrasion on left frontal scalp.  Pt also found to have right upper arm parathesia - which she reports has now resolved.  Swallow eval ordered as pt did not pass an RN SSS.  Imaging studies negative for neuro change, 1 cm hypodensity of left palatine tonsil, ? abscess vs neoplasm.     Assessment / Plan / Recommendation Clinical Impression  Pt with erythema on posterior of frontal dentition - hard palate region.  Suspect this is due to fall injury.  No focal CN deficits nor indication of airway compromise wiht po observed.  Pt does state odynophagia with mastication and her top dentures were broken with the fall thus placed her on soft diet.  Advised she avoid acidic, hot temperature or spicy foods if cause discomfort.  Pt did not pass 3 ounce water test = stating she couldn't swallow 3 ounces back to back.  No SlP follow up indicated.   SLP Visit Diagnosis: Dysphagia, unspecified (R13.10)    Aspiration Risk  No limitations    Diet Recommendation Dysphagia 3 (Mech soft);Thin liquid(due to dentition)   Liquid Administration via: Cup;Straw Medication Administration: Whole meds with liquid Supervision: Patient able to self  feed Compensations: Slow rate;Small sips/bites Postural Changes: Seated upright at 90 degrees;Remain upright for at least 30 minutes after po intake    Other  Recommendations Oral Care Recommendations: Oral care BID   Follow up Recommendations None      Frequency and Duration            Prognosis        Swallow Study   General Date of Onset: 04/21/18 HPI: Ashley Bruce is a 53 yo who was found on the floor by her daughter- found to have abrasion on left frontal scalp.  Pt also found to have right upper arm parathesia - which she reports has now resolved.  Swallow eval ordered as pt did not pass an RN SSS.  Imaging studies negative for neuro change, 1 cm hypodensity of left palatine tonsil, ? abscess vs neoplasm.   Previous Swallow Assessment: none Diet Prior to this Study: NPO Temperature Spikes Noted: No Respiratory Status: Room air History of Recent Intubation: No Behavior/Cognition: Alert;Cooperative;Pleasant mood Oral Cavity Assessment: Erythema(posterior frontal dentition, suspect d/t fall) Oral Care Completed by SLP: Yes(pt assisted with oral care) Oral Cavity - Dentition: Missing dentition(upper denture broke with the fall per family) Vision: Functional for self-feeding Self-Feeding Abilities: Able to feed self Patient Positioning: Upright in bed Baseline Vocal Quality: Normal Volitional Cough: Strong Volitional Swallow: Able to elicit    Oral/Motor/Sensory Function Overall Oral Motor/Sensory Function: Within functional limits   Ice Chips Ice chips: Not tested   Thin Liquid Thin Liquid: Within functional limits  Presentation: Cup;Straw    Nectar Thick Nectar Thick Liquid: Not tested   Honey Thick Honey Thick Liquid: Not tested   Puree Puree: Within functional limits Presentation: Self Fed;Spoon   Solid     Solid: Within functional limits Other Comments: slow mastication due to odynophagia from injury d/t fall - ? erthyma posterior top dentition      Macario Golds 04/21/2018,11:32 AM  Luanna Salk, MS [facility] SLP [place] Pager [phone] Office [phone]

## 2018-04-21 NOTE — Discharge Instructions (Signed)
Sncope Syncope Un sncope es cuando una persona se desvanece (desmaya) durante un corto tiempo. La causa del sncope es una disminucin sbita del flujo de sangre al cerebro. Los signos de que alguien est por [employer] incluyen lo siguiente:  [employer] o aturdido.  Ganas de vomitar (nuseas).  Ver todo blanco o negro.  Tener la piel fra y hmeda. Si se desmaya, solicite ayuda de inmediato. Comunquese con el servicio de emergencias de su localidad (911 en los [place]). No conduzca por sus propios medios [employer] hospital. Siga estas indicaciones en su casa: Controle si hay algn cambio en sus sntomas. Para mantener su seguridad y ayudar con los sntomas, tome estas medidas: Estilo de vida  No conduzca vehculos, no use maquinarias ni practique deportes hasta que el mdico lo autorice.  No beba alcohol.  No consuma ningn producto que contenga nicotina o tabaco, como cigarrillos y Psychologist, sport and exercise. Si necesita ayuda para dejar de fumar, consulte al mdico.  Beba suficiente lquido para mantener la orina de color amarillo plido. Indicaciones generales  [employer] de venta libre y los recetados solamente como se lo haya indicado el mdico.  Si toma medicamentos para la presin arterial o para el corazn, levntese y sintese lentamente. Dedique unos minutos a prepararse para sentarse y despus levantarse. Esto puede ayudarlo a sentirse [employer].  Pdale a alguien que se quede con usted hasta que se sienta estable.  Si comienza a sentir que [employer], recustese de inmediato y levante (eleve) los pies por encima del nivel del corazn. Respire profundamente y de [place] continua. Espere hasta que los sntomas hayan desaparecido.  Concurra a todas las visitas de seguimiento como se lo haya indicado el mdico. Esto es importante. Solicite ayuda de inmediato si:  Tiene un dolor de cabeza muy intenso.  Se desmaya una o ms  veces.  Siente dolor en el pecho, el abdomen o la espalda.  Tiene latidos cardacos muy rpidos o irregulares (palpitaciones).  Le duele al respirar.  Le sangran la boca o las nalgas (recto).  La materia fecal (heces) es negra o de aspecto alquitranado.  Tiene una crisis de movimientos que no puede controlar (convulsiones).  Se siente confundido.  Presenta dificultad para caminar.  Se siente muy dbil.  Tiene problemas de visin. Estos sntomas pueden Sales executive. No espere a ver si los sntomas desaparecen. Solicite atencin mdica de inmediato. Comunquese con el servicio de emergencias de su localidad (911 en los [place]). No conduzca por sus propios medios [employer]. Resumen  Un sncope es cuando una persona se desvanece (desmaya) durante un corto tiempo. La causa del sncope es una disminucin sbita del flujo de sangre al cerebro.  Los signos de que podra estar por [employer] incluyen sentirse mareado o aturdido, Best boy ganas de vomitar, verlo todo blanco o negro, o tener la piel fra y [place].  Si comienza a sentir que [employer], recustese de inmediato y levante (eleve) los pies por encima del nivel del corazn. Respire profundamente y de [employer] continua. Espere hasta que los sntomas hayan desaparecido. Esta informacin no tiene Marine scientist el consejo del mdico. Asegrese de hacerle al mdico cualquier pregunta que tenga. Document Released: 05/04/2008 Document Revised: 05/20/2017 Document Reviewed: 05/20/2017 Elsevier Interactive Patient Education  2019 [employer].    Conmocin cerebral en los adultos Concussion, Adult Introduccin Una conmocin cerebral es una lesin en el cerebro a causa de un impacto (golpe) directo en la cabeza o el  cuerpo. Esta lesin hace que el cerebro se sacuda rpidamente hacia atrs y [place] adelante dentro del crneo. Las causas pueden ser:  Un golpe en la cabeza.  Un movimiento rpido y brusco  (sacudida) de la cabeza o el cuello. La velocidad de recuperacin de una conmocin cerebral depende de muchos factores. La recuperacin puede llevar tiempo. Es importante esperar para volver a la actividad [employer] mdico determine que es seguro hacerlo y que los sntomas hayan desaparecido por completo. Siga estas indicaciones en su casa: Actividad  Limite las actividades que requieren mucha atencin o Estate manager/land agent. Es probable que Surveyor, minerals con su gerente en el trabajo o con los maestros al [place]. Limite las actividades tales como: ? Deberes escolares o trabajo. ? Mirar televisin. ? Trabajos en la computadora. ? Jugar juegos de memoria y armar rompecabezas.  Reposo. El reposo favorece la curacin del cerebro. Asegrese de hacer lo siguiente: ? Duerma bien por la noche. No se quede despierto hasta tarde. ? [place]. Tome siestas o haga pausas cuando se sienta cansado.  No realice actividades que pudieran provocar una segunda conmocin cerebral, como andar en bicicleta o practicar deportes. Puede ser peligroso sufrir otra conmocin cerebral antes de que se haya recuperado de la primera.  Pregntele al mdico cundo puede volver a Optometrist sus actividades cotidianas, como conducir, andar en bicicleta o Risk manager mquinas. Su capacidad para reaccionar podra ser ms lenta. No realice estas actividades si se siente mareado. Es probable que IT sales professional un plan para volver a Pharmacologist de a Futures trader. Indicaciones generales  [employer] medicamentos de venta libre y los recetados solamente como se lo haya indicado el mdico.  No beba alcohol hasta que el mdico lo autorice.  Controle sus sntomas y pdale a otras personas que tambin lo hagan. Puede tener otros problemas (complicaciones) despus de una conmocin cerebral. Los adultos mayores con lesin cerebral pueden tener un mayor riesgo de problemas graves, como un cogulo de Biochemist, clinical en el  cerebro.  Informe a su gerente en el trabajo, a los [employer], al departamento de enfermera de la escuela, al [employer], al instructor o al entrenador acerca de la lesin y los sntomas. Hgales saber lo que usted puede y lo que no Fish farm manager. Ellos debern observar si usted: ? Tiene ms problemas de atencin o concentracin. ? Tiene ms dificultad para recordar o aprender informacin nueva. ? Necesita ms tiempo para Optometrist las tareas o [employer]. ? Se siente ms molesto (irritable) o le cuesta ms manejar el estrs. ? Cualquier otro sntoma que empeore.  Concurra a todas las visitas de seguimiento como se lo haya indicado el mdico. Esto es importante. Prevencin  Es muy importante que evite otra lesin cerebral, especialmente antes de que se haya recuperado. En casos poco frecuentes, una nueva lesin puede causar daos cerebrales permanentes, hinchazn del cerebro o la muerte. Corre mayor riesgo si tiene otra lesin en la cabeza en los primeros 7 a 10das despus de la primera lesin. A fin de evitar las lesiones: ? Evite las actividades que podran ocasionarle otra conmocin cerebral, como los deportes de [place]. ? Cuando regrese a los deportes o a las actividades:  Evite las jugadas o movimientos que puedan ocasionar que choque contra Nurse, children's. Esta es la forma en que ocurre la mayora de las conmociones cerebrales.  Siga las reglas y respete a los dems jugadores. ? Haga ejercicio con regularidad que incluya entrenamiento de fuerza y equilibrio. ?  Use un casco cuando practique actividades como:  Andar en bicicleta.  Esquiar.  Andar en patineta.  Andar en patines. ? Los cascos pueden ayudar a protegerlo de las lesiones graves en el crneo y el cerebro, pero no lo protegen de una conmocin cerebral. Incluso al usar casco, debe tratar de no recibir un golpe en la cabeza. Comunquese con un mdico si:  Los sntomas no mejoran o empeoran.  Aparecen nuevos  sntomas.  Sufre una nueva lesin. Solicite ayuda de inmediato si:  Tiene dolores de cabeza intensos o estos empeoran.  Siente debilidad en alguna parte del cuerpo.  Se siente confundido.  Su coordinacin empeora.  No deja de vomitar.  Tiene ms sueo que lo habitual.  Se retuerce o se sacude violentamente o tiene una convulsin.  El habla no es clara (arrastra las palabras).  Tiene cambios de Building surveyor.  Tiene cambios en la forma de ver (visin).  Se desmaya (pierde el conocimiento). Resumen  Una conmocin cerebral es una lesin en el cerebro a causa de un impacto (golpe) directo en la cabeza o el cuerpo.  Esta afeccin se trata con reposo y un control cuidadoso de los sntomas.  Si contina teniendo sntomas, llame a su mdico. Esta informacin no tiene Marine scientist el consejo del mdico. Asegrese de hacerle al mdico cualquier pregunta que tenga. Document Released: 03/10/2010 Document Revised: 05/18/2017 Document Reviewed: 05/18/2017 Elsevier Interactive Patient Education  2019 [employer].

## 2018-04-21 NOTE — Progress Notes (Signed)
BSE completed, full report to follow.  Pt with negative CN exam and denies h/o dysphagia. She does have erythema on alveolar ridge posterior to frontal dentition.  In addition, pt and daughter report pt broke her dentures (upper) with fall.  Pt did not pass 3 ounce water test stating she couldn't do it but unable to state reasoning - although she did make an effort.  Recommend dys3/thin given dentition and erythema. Advised pt and daughters to pt to avoid acidic, spicy, carbonated or hot *temperature* items.  Will sign off after full note placed in chart.  Thanks. Luanna Salk, [facility] SLP Acute Rehab Services Pager [phone] Office [phone]

## 2018-04-30 ENCOUNTER — Ambulatory Visit: Payer: Self-pay | Admitting: Emergency Medicine

## 2018-04-30 ENCOUNTER — Encounter: Payer: Self-pay | Admitting: Emergency Medicine

## 2018-04-30 ENCOUNTER — Other Ambulatory Visit: Payer: Self-pay

## 2018-04-30 VITALS — BP 117/72 | HR 74 | Temp 98.5°F | Resp 18 | Ht 59.0 in | Wt 130.0 lb

## 2018-04-30 DIAGNOSIS — Z23 Encounter for immunization: Secondary | ICD-10-CM

## 2018-04-30 DIAGNOSIS — R55 Syncope and collapse: Secondary | ICD-10-CM

## 2018-04-30 NOTE — Patient Instructions (Addendum)
If you have lab work done today you will be contacted with your lab results within the next 2 weeks.  If you have not heard from [place] then please contact us. The fastest way to get your results is to register for My Chart.   IF you received an x-ray today, you will receive an invoice from [facility]. Please contact [facility] at [phone] with questions or concerns regarding your invoice.   IF you received labwork today, you will receive an invoice from [place]. Please contact [facility] at [phone] with questions or concerns regarding your invoice.   Our billing staff will not be able to assist you with questions regarding bills from these companies.  You will be contacted with the lab results as soon as they are available. The fastest way to get your results is to activate your My Chart account. Instructions are located on the last page of this paperwork. If you have not heard from [place] regarding the results in 2 weeks, please contact this office.    [place] (Health Maintenance, Female) Un estilo de vida saludable y los cuidados preventivos pueden favorecer considerablemente a la salud y Musician. Pregunte a su mdico cul es el cronograma de exmenes peridicos apropiado para usted. Esta es una buena oportunidad para consultarlo sobre cmo prevenir enfermedades y [place] sano. Adems de los controles, hay muchas otras cosas que puede hacer usted mismo. Los expertos han realizado numerosas investigaciones [employer] cambios en el estilo de vida y las medidas de prevencin que, [place], lo ayudarn a mantenerse sano. Solicite a su mdico ms informacin. EL PESO Y LA DIETA Consuma una dieta saludable.  Asegrese de [employer] verduras, frutas, productos lcteos de bajo contenido de [place] y Advertising account planner.  No consuma muchos alimentos de alto contenido de grasas slidas, azcares agregados o sal.  Realice actividad  fsica con regularidad. Esta es una de las prcticas ms importantes que puede hacer por su salud. ? La Delorise Shiner de los adultos deben hacer ejercicio durante al menos 181mnutos por semana. El ejercicio debe aumentar la frecuencia cardaca y pActorla transpiracin (ejercicio de i[place]. ? La mayora de los adultos tambin deben hacer ejercicios de elongacin al mToysRusveces a la semana. Agregue esto al su plan de ejercicio de intensidad moderada. Mantenga un peso saludable.  El ndice de masa corporal ([facility] es una medida que puede utilizarse para identificar posibles problemas de pAlligator Proporciona una estimacin de la grasa corporal basndose en el peso y la altura. Su mdico puede ayudarle a dRadiation protection practitionerILa [place] a lScientist, forensico mTheatre managerun peso saludable.  Para las mujeres de 20aos o ms: ? Un I[facility] Assocmen[facility] de 18,5 se considera bajo peso. ? Un IC[facility] Cent[facility] Paentre 18,5 y 24,9 es normal. ? Un IVa [facility] Systementre 25 y 29,9 se considera sobrepeso. ? Un IMC de 30 o ms se considera obesidad. Observe los niveles de colesterol y lpidos en la sangre.  Debe comenzar a rEnglish as a second language teacherde lpidos y cResearch officer, trade unionen la sangre a los 20aos y luego repetirlos cada 522aos  Es posible que nAutomotive engineerlos niveles de colesterol con mayor frecuencia si: ? Sus niveles de lpidos y colesterol son altos. ? Es mayor de [id number] ? Presenta un alto riesgo de padecer enfermedades cardacas. DETECCIN DE CNCER Cncer de pulmn  Se recomienda realizar exmenes de deteccin de cncer de pulmn a personas adultas entre 59y 855aos que estn en riesgo  de Horticulturist, commercial de pulmn por sus antecedentes de consumo de tabaco.  Se recomienda una tomografa computarizada de baja dosis de los [employer] aos a las personas que: ? Fuman actualmente. ? Hayan dejado el hbito en algn momento en los ltimos 15aos. ? Hayan fumado durante 30aos un paquete diario. Un paquete-ao equivale a fumar un promedio de  un paquete de cigarrillos diario durante un ao.  Los exmenes de deteccin anuales deben continuar hasta que hayan pasado 15aos desde que dej de fumar.  Ya no debern realizarse si tiene un problema de salud que le impida recibir tratamiento para Science writer de pulmn. Cncer de mama  Practique la autoconciencia de la mama. Esto significa reconocer la apariencia normal de sus mamas y cmo las siente.  Tambin significa realizar autoexmenes regulares de [employer]. Informe a su mdico sobre cualquier cambio, sin importar cun pequeo sea.  Si tiene entre 20 y 67 aos, un mdico debe realizarle un examen clnico de las mamas como parte del examen regular de [place], cada 1 a 3aos.  Si tiene 40aos o ms, debe Information systems manager clnico de las [employer]. Tambin considere realizarse una [place] todos los [place].  Si tiene antecedentes familiares de cncer de mama, hable con su mdico para someterse a un estudio gentico.  Si tiene alto riesgo de Chief Financial Officer de mama, hable con su mdico para someterse a Public house manager y [employer].  La evaluacin del gen del cncer de mama (BRCA) se recomienda a mujeres que tengan familiares con cnceres relacionados con el BRCA. Los cnceres relacionados con el BRCA incluyen los siguientes: ? [place]. ? Ovario. ? [place]. ? Cnceres de peritoneo.  Los resultados de la evaluacin determinarn la necesidad de asesoramiento gentico y de [place] de BRCA1 y BRCA2. Cncer de cuello del tero El mdico puede recomendarle que se haga pruebas peridicas de deteccin de cncer de los rganos de la pelvis (ovarios, tero y vagina). Estas pruebas incluyen un examen plvico, que abarca controlar si se produjeron cambios microscpicos en la superficie del cuello del tero (prueba de Papanicolaou). Pueden recomendarle que se haga estas pruebas cada 3aos, a partir de los 21aos.  A las mujeres  que tienen entre 30 y 59aos, los mdicos pueden recomendarles que se sometan a exmenes plvicos y pruebas de Papanicolaou cada 58aos, o a la prueba de Papanicolaou y el examen plvico en combinacin con estudios de deteccin del virus del papiloma humano (VPH) cada 5aos. Algunos tipos de VPH aumentan el riesgo de Chief Financial Officer de cuello del tero. La prueba para la deteccin del VPH tambin puede realizarse a mujeres de cualquier edad cuyos resultados de la prueba de Papanicolaou no sean claros.  Es posible que otros mdicos no recomienden exmenes de deteccin a mujeres no embarazadas que se consideran sujetos de bajo riesgo de Chief Financial Officer de pelvis y que no tienen sntomas. Pregntele al mdico si un examen plvico de deteccin es adecuado para usted.  Si ha recibido un tratamiento para Science writer cervical o una enfermedad que podra causar cncer, necesitar realizarse una prueba de Papanicolaou y controles durante al menos 25 aos de concluido el [place]. Si no se ha hecho el Papanicolaou con regularidad, debern volver a evaluarse los factores de riesgo (como tener un nuevo compaero sexual), para Teacher, adult education si debe realizarse los estudios nuevamente. Algunas mujeres sufren problemas mdicos que aumentan la probabilidad de Museum/gallery curator cncer de cuello del tero.  En estos casos, el mdico podr indicar que se realicen controles y pruebas de Papanicolaou con ms frecuencia. Cncer colorrectal  Este tipo de cncer puede detectarse y a menudo prevenirse.  Por lo general, los estudios de rutina se deben comenzar a hacer a partir de los 50 aos y hasta los 75 aos.  Sin embargo, el mdico podr aconsejarle que lo haga antes, si tiene factores de riesgo para el cncer de colon.  Tambin puede recomendarle que use un kit de prueba para hallar sangre oculta en la materia fecal.  Es posible que se use una pequea cmara en el extremo de un tubo para examinar directamente el colon (sigmoidoscopia  o colonoscopia) a fin de detectar formas tempranas de cncer colorrectal.  Los exmenes de rutina generalmente comienzan a los 50aos.  El examen directo del colon se debe repetir cada 5 a 10aos hasta los 75aos. Sin embargo, es posible que se realicen exmenes con mayor frecuencia, si se detectan formas tempranas de plipos precancerosos o pequeos bultos. Cncer de piel  Revise la piel de la cabeza a los pies con regularidad.  Informe a su mdico si aparecen nuevos lunares o los que tiene se modifican, especialmente en su forma y color.  Tambin notifique al mdico si tiene un lunar que es ms grande que el tamao de una goma de lpiz.  Siempre use pantalla solar. Aplique pantalla solar de manera libre y repetida a lo largo del da.  Protjase usando mangas y pantalones largos, un sombrero de ala ancha y gafas para el sol, siempre que se encuentre en el exterior. ENFERMEDADES CARDACAS, DIABETES E HIPERTENSIN ARTERIAL  La hipertensin arterial causa enfermedades cardacas y aumenta el riesgo de ictus. La hipertensin arterial es ms probable en los siguientes casos: ? Las personas que tienen la presin arterial en el extremo del rango normal (100-139/85-89 mm Hg). ? Las personas con sobrepeso u obesidad. ? Las personas afroamericanas.  Si usted tiene entre 18 y 39 aos, debe medirse la presin arterial cada 3 a 5 aos. Si usted tiene 40 aos o ms, debe medirse la presin arterial todos los aos. Debe medirse la presin arterial dos veces: una vez cuando est en un hospital o una clnica y la otra vez cuando est en otro sitio. Registre el promedio de las dos mediciones. Para controlar su presin arterial cuando no est en un hospital o una clnica, puede usar lo siguiente: ? Una mquina automtica para medir la presin arterial en una farmacia. ? Un monitor para medir la presin arterial en el hogar.  Si tiene entre 55 y 79 aos, consulte a su mdico si debe tomar aspirina para  prevenir el ictus.  Realcese exmenes de deteccin de la diabetes con regularidad. Esto incluye la toma de una muestra de sangre para controlar el nivel de azcar en la sangre durante el ayuno. ? Si tiene un peso normal y un bajo riesgo de padecer diabetes, realcese este anlisis cada tres aos despus de los 45aos. ? Si tiene sobrepeso y un alto riesgo de padecer diabetes, considere someterse a este anlisis antes o con mayor frecuencia. PREVENCIN DE INFECCIONES HepatitisB  Si tiene un riesgo ms alto de contraer hepatitis B, debe someterse a un examen de deteccin de este virus. Se considera que tiene un alto riesgo de contraer hepatitis B si: ? Naci en un pas donde la hepatitis B es frecuente. Pregntele a su mdico qu pases son considerados de alto riesgo. ? Sus padres nacieron en un   pas de alto riesgo y usted no recibi una vacuna que lo proteja contra la hepatitis B (vacuna contra la hepatitis B). ? Tiene VIH o sida. ? Usa agujas para inyectarse drogas. ? Vive con alguien que tiene hepatitis B. ? Ha tenido sexo con alguien que tiene hepatitis B. ? Recibe tratamiento de hemodilisis. ? Toma ciertos medicamentos para el cncer, trasplante de rganos y afecciones autoinmunitarias. Hepatitis C  Se recomienda un anlisis de sangre para: ? Todos los que nacieron entre 1945 y 1965. ? Todas las personas que tengan un riesgo de haber contrado hepatitis C. Enfermedades de transmisin sexual (ETS).  Debe realizarse pruebas de deteccin de enfermedades de transmisin sexual (ETS), incluidas gonorrea y clamidia si: ? Es sexualmente activo y es menor de 24aos. ? Es mayor de 24aos, y el mdico le informa que corre riesgo de tener este tipo de infecciones. ? La actividad sexual ha cambiado desde que le hicieron la ltima prueba de deteccin y tiene un riesgo mayor de tener clamidia o gonorrea. Pregntele al mdico si usted tiene riesgo.  Si no tiene el VIH, pero corre riesgo de  infectarse por el virus, se recomienda tomar diariamente un medicamento recetado para evitar la infeccin. Esto se conoce como profilaxis previa a la exposicin. Se considera que est en riesgo si: ? Es activo sexualmente y no usa preservativos habitualmente o no conoce el estado del VIH de sus parejas sexuales. ? Se inyecta drogas. ? Es activo sexualmente con una pareja que tiene VIH. Consulte a su mdico para saber si tiene un alto riesgo de infectarse por el VIH. Si opta por comenzar la profilaxis previa a la exposicin, primero debe realizarse anlisis de deteccin del VIH. Luego, le harn anlisis cada 3meses mientras est tomando los medicamentos para la profilaxis previa a la exposicin. EMBARAZO  Si es premenopusica y puede quedar embarazada, solicite a su mdico asesoramiento previo a la concepcin.  Si puede quedar embarazada, tome 400 a 800microgramos (mcg) de cido flico todos los das.  Si desea evitar el embarazo, hable con su mdico sobre el control de la natalidad (anticoncepcin). OSTEOPOROSIS Y MENOPAUSIA  La osteoporosis es una enfermedad en la que los huesos pierden los minerales y la fuerza por el avance de la edad. El resultado pueden ser fracturas graves en los huesos. El riesgo de osteoporosis puede identificarse con una prueba de densidad sea.  Si tiene 65aos o ms, o si est en riesgo de sufrir osteoporosis y fracturas, pregunte a su mdico si debe someterse a exmenes.  Consulte a su mdico si debe tomar un suplemento de calcio o de vitamina D para reducir el riesgo de osteoporosis.  La menopausia puede presentar ciertos sntomas fsicos y riesgos.  La terapia de reemplazo hormonal puede reducir algunos de estos sntomas y riesgos. Consulte a su mdico para saber si la terapia de reemplazo hormonal es conveniente para usted. INSTRUCCIONES PARA EL CUIDADO EN EL HOGAR  Realcese los estudios de rutina de la salud, dentales y de la vista.  Mantngase al da  con las vacunas.  No consuma ningn producto que contenga tabaco, lo que incluye cigarrillos, tabaco de mascar o cigarrillos electrnicos.  Si est embarazada, no beba alcohol.  Si est amamantando, reduzca el consumo de alcohol y la frecuencia con la que consume.  Si es mujer y no est embarazada limite el consumo de alcohol a no ms de 1 medida por da. Una medida equivale a 12onzas de cerveza, 5onzas de vino o 1onzas de   bebidas alcohlicas de alta graduacin.  No consuma drogas.  No comparta agujas.  Solicite ayuda a su mdico si necesita apoyo o informacin para abandonar las drogas.  Informe a su mdico si a menudo se siente deprimido.  Notifique a su mdico si alguna vez ha sido vctima de abuso o si no se siente seguro en su hogar. Esta informacin no tiene como fin reemplazar el consejo del mdico. Asegrese de hacerle al mdico cualquier pregunta que tenga. Document Released: 01/25/2011 Document Revised: 02/26/2014 Document Reviewed: 11/09/2014 Elsevier Interactive Patient Education  2019 [employer]vier Inc.  

## 2018-04-30 NOTE — Progress Notes (Signed)
Hospital Course: 53 year old female with history of hypertension who was admitted for unresponsive episode the morning of admission.  Patient felt some abdominal pain and diaphoresis before the incident.  She was unconscious for about a minute.  No notable seizure-like activity per family.  Had brief episode of tingling and numbness in her arm that has resolved.  No associated nausea, vomiting or recent illness.  CTA head and neck were negative for any acute abnormality.  She had some left upper extremity compared to right due to underlying chronic shoulder pain.  Chest and shoulder x-rays were not impressive.  Troponin and EKG not impressive.  No event on telemetry.  MRI brain negative for acute intracranial process.  Echocardiogram was basically normal as well.  Evaluated by physical therapy and SLP and no need was identified. Discharged home in stable condition.  Concern for concussion.  Recommended complete physical and mental rest at least for a week.  Patient to follow-up with PCP in 1 to 2 weeks.  Ashley Bruce 53 y.o.   Chief Complaint  Patient presents with  . Follow-up    follow up from ED for fainting at home     HISTORY OF PRESENT ILLNESS: This is a 53 y.o. female seen in the emergency room on 04/20/2018 after she had a syncopal episode at home.  Negative work-up in the ER and released home the same day. ED note reviewed.  Noted above. Feels fine today.  Has no complaints or medical concerns.  HPI   Prior to Admission medications   Medication Sig Start Date End Date Taking? Authorizing Provider  IBUPROFEN PO Take by mouth.   Yes [provider]  lisinopril (PRINIVIL,ZESTRIL) 5 MG tablet Take 5 mg by mouth daily.   Yes [provider]    Allergies  Allergen Reactions  . Tylenol [Acetaminophen] Itching and Other (See Comments)    Blood pressure rises    Patient Active Problem List   Diagnosis Date Noted  . Syncope 04/20/2018  . Diarrhea 12/15/2014  .  S/P laparoscopic cholecystectomy 12/07/2013    Past Medical History:  Diagnosis Date  . Anemia   . Fibroids   . Hypertension     Past Surgical History:  Procedure Laterality Date  . CESAREAN SECTION    . CHOLECYSTECTOMY  12/07/2013  . CHOLECYSTECTOMY N/A 12/07/2013   Procedure: LAPAROSCOPIC CHOLECYSTECTOMY WITH INTRAOPERATIVE CHOLANGIOGRAM;  Surgeon: Coralie Keens, MD;  Location: [facility];  Service: General;  Laterality: N/A;    Social History   Socioeconomic History  . Marital status: Widowed    Spouse name: Not on file  . Number of children: Not on file  . Years of education: Not on file  . Highest education level: Not on file  Occupational History  . Not on file  Social Needs  . Financial resource strain: Not on file  . Food insecurity:    Worry: Not on file    Inability: Not on file  . Transportation needs:    Medical: Not on file    Non-medical: Not on file  Tobacco Use  . Smoking status: Never Smoker  . Smokeless tobacco: Never Used  Substance and Sexual Activity  . Alcohol use: No  . Drug use: No  . Sexual activity: Not on file  Lifestyle  . Physical activity:    Days per week: Not on file    Minutes per session: Not on file  . Stress: Not on file  Relationships  . Social connections:  Talks on phone: Not on file    Gets together: Not on file    Attends religious service: Not on file    Active member of club or organization: Not on file    Attends meetings of clubs or organizations: Not on file    Relationship status: Not on file  . Intimate partner violence:    Fear of current or ex partner: Not on file    Emotionally abused: Not on file    Physically abused: Not on file    Forced sexual activity: Not on file  Other Topics Concern  . Not on file  Social History Narrative  . Not on file    History reviewed. No pertinent family history.   Review of Systems  Constitutional: Negative.  Negative for chills, fever and weight loss.  HENT:  Negative.  Negative for hearing loss.   Eyes: Negative.  Negative for blurred vision and double vision.  Respiratory: Negative.  Negative for cough and shortness of breath.   Cardiovascular: Negative.  Negative for chest pain and palpitations.  Gastrointestinal: Negative.  Negative for abdominal pain, diarrhea, nausea and vomiting.  Genitourinary: Negative.  Negative for dysuria and hematuria.  Musculoskeletal: Negative.   Skin: Negative.  Negative for rash.  Neurological: Negative.  Negative for dizziness and headaches.  Endo/Heme/Allergies: Negative.   All other systems reviewed and are negative.  Vitals:   04/30/18 1604  BP: 117/72  Pulse: 74  Resp: 18  Temp: 98.5 F (36.9 C)  SpO2: 98%    Physical Exam Vitals signs reviewed.  Constitutional:      Appearance: Normal appearance.  HENT:     Head: Normocephalic and atraumatic.     Nose: Nose normal.     Mouth/Throat:     Mouth: Mucous membranes are moist.     Pharynx: Oropharynx is clear.  Eyes:     Extraocular Movements: Extraocular movements intact.     Conjunctiva/sclera: Conjunctivae normal.     Pupils: Pupils are equal, round, and reactive to light.  Neck:     Musculoskeletal: Normal range of motion.  Cardiovascular:     Rate and Rhythm: Normal rate and regular rhythm.     Pulses: Normal pulses.     Heart sounds: Normal heart sounds.  Pulmonary:     Effort: Pulmonary effort is normal.     Breath sounds: Normal breath sounds.  Abdominal:     General: There is no distension.     Palpations: Abdomen is soft.     Tenderness: There is no abdominal tenderness.  Musculoskeletal: Normal range of motion.  Skin:    General: Skin is warm and dry.     Capillary Refill: Capillary refill takes less than 2 seconds.  Neurological:     General: No focal deficit present.     Mental Status: She is alert and oriented to person, place, and time.     Sensory: No sensory deficit.     Motor: No weakness.     Coordination:  Coordination normal.  Psychiatric:        Mood and Affect: Mood normal.        Behavior: Behavior normal.      ASSESSMENT & PLAN: Ashley Bruce was seen today for follow-up.  Diagnoses and all orders for this visit:  Vasovagal syncope  Need for influenza vaccination -     Flu Vaccine QUAD 36+ mos IM   Patient Instructions       If you have lab work done today  you will be contacted with your lab results within the next 2 weeks.  If you have not heard from [place] then please contact us. The fastest way to get your results is to register for My Chart.   IF you received an x-ray today, you will receive an invoice from [facility]. Please contact [facility] at [phone] with questions or concerns regarding your invoice.   IF you received labwork today, you will receive an invoice from [facility]. Please contact [facility] at [phone] with questions or concerns regarding your invoice.   Our billing staff will not be able to assist you with questions regarding bills from these companies.  You will be contacted with the lab results as soon as they are available. The fastest way to get your results is to activate your My Chart account. Instructions are located on the last page of this paperwork. If you have not heard from [place] regarding the results in 2 weeks, please contact this office.    [place] (Health Maintenance, Female) Un estilo de vida saludable y los cuidados preventivos pueden favorecer considerablemente a la salud y Musician. Pregunte a su mdico cul es el cronograma de exmenes peridicos apropiado para usted. Esta es una buena oportunidad para consultarlo sobre cmo prevenir enfermedades y [place] sano. Adems de los controles, hay muchas otras cosas que puede hacer usted mismo. Los expertos han realizado numerosas investigaciones [employer]itor cambios en el estilo de vida y las medidas de prevencin que, [place], lo  ayudarn a mantenerse sano. Solicite a su mdico ms informacin. EL PESO Y LA DIETA Consuma una dieta saludable.  Asegrese de [employer] Stores verduras, frutas, productos lcteos de bajo contenido de [place] y Advertising account planner.  No consuma muchos alimentos de alto contenido de grasas slidas, azcares agregados o sal.  Realice actividad fsica con regularidad. Esta es una de las prcticas ms importantes que puede hacer por su salud. ? La Delorise Shiner de los adultos deben hacer ejercicio durante al menos 153mnutos por semana. El ejercicio debe aumentar la frecuencia cardaca y pActorla transpiracin (ejercicio de iMinburn. ? La mayora de los adultos tambin deben hacer ejercicios de elongacin al mToysRusveces a la semana. Agregue esto al su plan de ejercicio de intensidad moderada. Mantenga un peso saludable.  El ndice de masa corporal ([facility] es una medida que puede utilizarse para identificar posibles problemas de p[place]ounds[place] Proporciona una estimacin de la grasa corporal basndose en el peso y la altura. Su mdico puede ayudarle a dRadiation protection practitionerIBloomfieldy a lScientist, forensico mTheatre managerun peso saludable.  Para las mujeres de 20aos o ms: ? Un ITemecula Valley Hospitalmenor de 18,5 se considera bajo peso. ? Un ISt. Luke'S Hospital At The Vintageentre 18,5 y 24,9 es normal. ? Un IBaptist Emergency Hospital - Zarzamoraentre 25 y 29,9 se considera sobrepeso. ? Un IMC de 30 o ms se considera obesidad. Observe los niveles de colesterol y lpidos en la sangre.  Debe comenzar a rEnglish as a second language teacherde lpidos y cResearch officer, trade unionen la sangre a los 20aos y luego repetirlos cada 545aos  Es posible que nAutomotive engineerlos niveles de colesterol con mayor frecuencia si: ? Sus niveles de lpidos y colesterol son altos. ? Es mayor de [id number] ? Presenta un alto riesgo de padecer enfermedades cardacas. DETECCIN DE CNCER Cncer de pulmn  Se recomienda realizar exmenes de deteccin de cncer de pulmn a personas adultas entre 587y 884aos que estn en riesgo de dHorticulturist, commercialde  pulmn por sus antecedentes de  consumo de tabaco.  Se recomienda una tomografa computarizada de baja dosis de los [employer] aos a las personas que: ? Fuman actualmente. ? Hayan dejado el hbito en algn momento en los ltimos 15aos. ? Hayan fumado durante 30aos un paquete diario. Un paquete-ao equivale a fumar un promedio de un paquete de cigarrillos diario durante un ao.  Los exmenes de deteccin anuales deben continuar hasta que hayan pasado 15aos desde que dej de fumar.  Ya no debern realizarse si tiene un problema de salud que le impida recibir tratamiento para Science writer de pulmn. Cncer de mama  Practique la autoconciencia de la mama. Esto significa reconocer la apariencia normal de sus mamas y cmo las siente.  Tambin significa realizar autoexmenes regulares de [employer]. Informe a su mdico sobre cualquier cambio, sin importar cun pequeo sea.  Si tiene entre 20 y 90 aos, un mdico debe realizarle un examen clnico de las mamas como parte del examen regular de [place], cada 1 a 3aos.  Si tiene 40aos o ms, debe Information systems manager clnico de las Microsoft. Tambin considere realizarse una Hand ([place]) todos los [place].  Si tiene antecedentes familiares de cncer de mama, hable con su mdico para someterse a un estudio gentico.  Si tiene alto riesgo de Chief Financial Officer de mama, hable con su mdico para someterse a Public house manager y [employer].  La evaluacin del gen del cncer de mama (BRCA) se recomienda a mujeres que tengan familiares con cnceres relacionados con el BRCA. Los cnceres relacionados con el BRCA incluyen los siguientes: ? Worden. ? Ovario. ? Trompas. ? Cnceres de peritoneo.  Los resultados de la evaluacin determinarn la necesidad de asesoramiento gentico y de Umbarger de BRCA1 y BRCA2. Cncer de cuello del tero El mdico puede recomendarle que se haga pruebas peridicas  de deteccin de cncer de los rganos de la pelvis (ovarios, tero y vagina). Estas pruebas incluyen un examen plvico, que abarca controlar si se produjeron cambios microscpicos en la superficie del cuello del tero (prueba de Papanicolaou). Pueden recomendarle que se haga estas pruebas cada 3aos, a partir de los 21aos.  A las mujeres que tienen entre 30 y 82aos, los mdicos pueden recomendarles que se sometan a exmenes plvicos y pruebas de Papanicolaou cada 3aos, o a la prueba de Papanicolaou y el examen plvico en combinacin con estudios de deteccin del virus del papiloma humano (VPH) cada 5aos. Algunos tipos de VPH aumentan el riesgo de Chief Financial Officer de cuello del tero. La prueba para la deteccin del VPH tambin puede realizarse a mujeres de cualquier edad cuyos resultados de la prueba de Papanicolaou no sean claros.  Es posible que otros mdicos no recomienden exmenes de deteccin a mujeres no embarazadas que se consideran sujetos de bajo riesgo de Chief Financial Officer de pelvis y que no tienen sntomas. Pregntele al mdico si un examen plvico de deteccin es adecuado para usted.  Si ha recibido un tratamiento para Science writer cervical o una enfermedad que podra causar cncer, necesitar realizarse una prueba de Papanicolaou y controles durante al menos 65 aos de concluido el [place]. Si no se ha hecho el Papanicolaou con regularidad, debern volver a evaluarse los factores de riesgo (como tener un nuevo compaero sexual), para Teacher, adult education si debe realizarse los estudios nuevamente. Algunas mujeres sufren problemas mdicos que aumentan la probabilidad de Museum/gallery curator cncer de cuello del tero. En estos casos, el mdico podr QUALCOMM se realicen  controles y pruebas de Papanicolaou con ms frecuencia. Cncer colorrectal  Este tipo de cncer puede detectarse y a menudo prevenirse.  Por lo general, los estudios de rutina se deben Medical laboratory scientific officer a Field seismologist a Proofreader de los 44 aos y Sidney 52  aos.  Sin embargo, el mdico podr aconsejarle que lo haga antes, si tiene factores de riesgo para el cncer de colon.  Tambin puede recomendarle que use un kit de prueba para Hydrologist en la materia fecal.  Es posible que se use una pequea cmara en el extremo de un tubo para examinar directamente el colon (sigmoidoscopia o colonoscopia) a fin de Hydrographic surveyor formas tempranas de cncer colorrectal.  Los exmenes de rutina generalmente comienzan a los 66aos.  El examen directo del colon se debe repetir cada 5 a 10aos hasta los 75aos. Sin embargo, es posible que se realicen exmenes con mayor frecuencia, si se detectan formas tempranas de plipos precancerosos o pequeos bultos. Cncer de piel  Revise la piel de la cabeza a los pies con regularidad.  Informe a su mdico si aparecen nuevos lunares o los que tiene se modifican, especialmente en su forma y color.  Tambin notifique al mdico si tiene un lunar que es ms grande que el tamao de una goma de lpiz.  Siempre use pantalla solar. Aplique pantalla solar de Kerry Dory y repetida a lo largo del Training and development officer.  Protjase usando mangas y [employer], un sombrero de ala ancha y gafas para el sol, siempre que se encuentre en el exterior. ENFERMEDADES CARDACAS, DIABETES E HIPERTENSIN ARTERIAL  La hipertensin arterial causa enfermedades cardacas y [place] el riesgo de ictus. La hipertensin arterial es ms probable en los siguientes casos: ? Las personas que tienen la presin arterial en el extremo del rango normal (100-139/85-89 mm Hg). ? [employer] Corporation con sobrepeso u obesidad. ? Scientist, water quality.  Si usted tiene entre 18 y 39 aos, debe medirse la presin arterial cada 3 a 5 aos. Si usted tiene 40 aos o ms, debe medirse la presin arterial Hewlett[employer]ard. Debe medirse la presin arterial dos veces: una vez cuando est en un hospital o una clnica y la otra vez cuando est en otro sitio. Registre el promedio  de [employer] Department Stores. Para controlar su presin arterial cuando no est en un hospital o [place], puede usar lo siguiente: ? Jorje Guild automtica para medir la presin arterial en una farmacia. ? Un monitor para medir la presin arterial en el hogar.  Si tiene entre 44 y 57 aos, consulte a su mdico si debe tomar aspirina para prevenir el ictus.  Realcese exmenes de deteccin de la diabetes con regularidad. Esto incluye la toma de [place] de sangre para controlar el nivel de azcar en la sangre durante el [place]. ? Si tiene un peso normal y un bajo riesgo de padecer diabetes, realcese este anlisis cada tres aos despus de los 45aos. ? Si tiene sobrepeso y un alto riesgo de padecer diabetes, considere someterse a este anlisis antes o con mayor frecuencia. PREVENCIN DE INFECCIONES HepatitisB  Si tiene un riesgo ms alto de Museum/gallery curator hepatitis B, debe someterse a un examen de deteccin de este virus. Se considera que tiene un alto riesgo de contraer hepatitis B si: ? Naci en un pas donde la hepatitis B es frecuente. Pregntele a su mdico qu pases son considerados de Public affairs consultant. ? Sus padres [employer] Mills un pas de alto riesgo y usted no recibi Engineer, drilling  que lo proteja contra la hepatitis B (vacuna contra la hepatitis B). ? [place]. ? [place] agujas para inyectarse drogas. ? Vive con alguien que tiene hepatitis B. ? Ha tenido sexo con alguien que tiene hepatitis B. ? Recibe tratamiento de hemodilisis. ? Toma ciertos medicamentos para el cncer, trasplante de rganos y afecciones autoinmunitarias. Hepatitis C  Se recomienda un anlisis de [place] para: ? [employer] 1947 y 1967. ? Todas las personas que tengan un riesgo de haber contrado hepatitis C. Enfermedades de transmisin sexual (ETS).  Debe realizarse pruebas de deteccin de enfermedades de transmisin sexual (ETS), incluidas gonorrea y clamidia si: ? Es sexualmente activo y es menor de  19JKD. ? Es mayor de 24aos, y Investment banker, operational informa que corre riesgo de tener este tipo de infecciones. ? La actividad sexual ha cambiado desde que le hicieron la ltima prueba de deteccin y tiene un riesgo mayor de Best boy clamidia o Radio broadcast assistant. Pregntele al mdico si usted tiene riesgo.  Si no tiene el VIH, pero corre riesgo de infectarse por el virus, se recomienda tomar diariamente un medicamento recetado para evitar la infeccin. Esto se conoce como profilaxis previa a la exposicin. Se considera que est en riesgo si: ? Es [place] sexualmente y no [place] preservativos habitualmente o no conoce el estado del VIH de sus Advertising copywriter. ? Se inyecta drogas. ? Es [place] sexualmente con Ardelia Mems pareja que tiene VIH. Consulte a su mdico para saber si tiene un alto riesgo de infectarse por el VIH. Si opta por comenzar la profilaxis previa a la exposicin, primero debe realizarse anlisis de deteccin del VIH. Luego, le harn anlisis cada 1mses mientras est tomando los medicamentos para la profilaxis previa a la exposicin. EBerkshire Eye LLC Si es premenopusica y puede quedar e[place] solicite a su mdico asesoramiento previo a la concepcin.  Si puede quedar embarazada, tome 400 a [id number](mcg) de cido fAnheuser-Busch  Si desea evitar el embarazo, hable con su mdico sobre el control de la natalidad (anticoncepcin). OSTEOPOROSIS Y MENOPAUSIA  La osteoporosis es una enfermedad en la que los huesos pierden los minerales y la fuerza por el avance de la edad. El resultado pueden ser fracturas graves en los hEggl[place] El riesgo de osteoporosis puede identificarse con uArdelia Memsprueba de densidad sea.  Si tiene 65aos o ms, o si est en riesgo de sufrir osteoporosis y fracturas, pregunte a su mdico si debe someterse a exmenes.  Consulte a su mdico si debe tomar un suplemento de calcio o de vitamina D para reducir el riesgo de osteoporosis.  La menopausia puede presentar ciertos sntomas  fsicos y rGaffer  La terapia de reemplazo hormonal puede reducir algunos de estos sntomas y rGaffer Consulte a su mdico para saber si la terapia de reemplazo hormonal es conveniente para usted. INSTRUCCIONES PARA EL CUIDADO EN EL HOGAR  Realcese los estudios de rutina de la salud, dentales y de lPublic librarian  MKirkpatrick  No consuma ningn producto que contenga tabaco, lo que incluye cigarrillos, tabaco de mHigher education careers advisero cPsychologist, sport and exercise  Si est embarazada, no beba alcohol.  Si est amamantando, reduzca el consumo de alcohol y la frecuencia con la que consume.  Si es mujer y no est embarazada limite el consumo de alcohol a no ms de 1 medida por da. Una medida equivale a 12onzas de cerveza, 5onzas de vino o 1onzas de bebidas alcohlicas de alta graduacin.  No consuma drogas.  No comparta agujas.  Solicite ayuda a su mdico si necesita apoyo o informacin para abandonar las drogas.  Informe a su mdico si a menudo se siente deprimido.  Notifique a su mdico si alguna vez ha sido vctima de abuso o si no se siente seguro en su hogar. Esta informacin no tiene Marine scientist el consejo del mdico. Asegrese de hacerle al mdico cualquier pregunta que tenga. Document Released: 01/25/2011 Document Revised: 02/26/2014 Document Reviewed: 11/09/2014 Elsevier Interactive Patient Education  2019 Elsevier Inc.      Agustina Caroli, MD [facility]

## 2020-06-04 ENCOUNTER — Other Ambulatory Visit: Payer: Self-pay

## 2020-06-04 ENCOUNTER — Ambulatory Visit (HOSPITAL_COMMUNITY)
Admission: EM | Admit: 2020-06-04 | Discharge: 2020-06-04 | Disposition: A | Discharge status: Home / Self Care | Payer: Self-pay | Attending: Physician Assistant | Admitting: Physician Assistant

## 2020-06-04 DIAGNOSIS — J0110 Acute frontal sinusitis, unspecified: Secondary | ICD-10-CM

## 2020-06-04 MED ORDER — AMOXICILLIN-POT CLAVULANATE 875-125 MG PO TABS: 1.0000 | ORAL_TABLET | Freq: Two times a day (BID) | ORAL | 0 refills | Status: AC

## 2020-06-04 NOTE — ED Provider Notes (Signed)
[facility]    CSN: [id number] Arrival date & time: 06/04/20  1141      History   Chief Complaint Chief Complaint  Patient presents with  . Cough  . Sore Throat    HPI Ashley Bruce is a 55 y.o. female.   Pt complains of one week of sinus pressure, sore throat, cough.  Denies fever, chills, shortness of breath, body aches, n/v/d.  She has taken allergy medication with minimal relief.  Denies sick contacts.  She is vaccinated against COVID.      Past Medical History:  Diagnosis Date  . Anemia   . Fibroids   . Hypertension     Patient Active Problem List   Diagnosis Date Noted  . Syncope 04/20/2018  . Diarrhea 12/15/2014  . S/P laparoscopic cholecystectomy 12/07/2013    Past Surgical History:  Procedure Laterality Date  . CESAREAN SECTION    . CHOLECYSTECTOMY  12/07/2013  . CHOLECYSTECTOMY N/A 12/07/2013   Procedure: LAPAROSCOPIC CHOLECYSTECTOMY WITH INTRAOPERATIVE CHOLANGIOGRAM;  Surgeon: Coralie Keens, MD;  Location: [facility];  Service: General;  Laterality: N/A;    OB History   No obstetric history on file.      Home Medications    Prior to Admission medications   Medication Sig Start Date End Date Taking? Authorizing Provider  amoxicillin-clavulanate (AUGMENTIN) 875-125 MG tablet Take 1 tablet by mouth every 12 (twelve) hours for 5 days. 06/04/20 06/09/20 Yes Lakendrick Paradis, PA-C  IBUPROFEN PO Take by mouth.    [provider]  lisinopril (PRINIVIL,ZESTRIL) 5 MG tablet Take 5 mg by mouth daily.    [provider]    Family History No family history on file.  Social History Social History   Tobacco Use  . Smoking status: Never Smoker  . Smokeless tobacco: Never Used  Substance Use Topics  . Alcohol use: No  . Drug use: No     Allergies   Tylenol [acetaminophen]   Review of Systems Review of Systems  Constitutional: Negative for chills and fever.  HENT: Positive for congestion and sore throat. Negative  for ear pain.   Eyes: Negative for pain and visual disturbance.  Respiratory: Positive for cough. Negative for shortness of breath.   Cardiovascular: Negative for chest pain and palpitations.  Gastrointestinal: Negative for abdominal pain and vomiting.  Genitourinary: Negative for dysuria and hematuria.  Musculoskeletal: Negative for arthralgias and back pain.  Skin: Negative for color change and rash.  Neurological: Negative for seizures and syncope.  All other systems reviewed and are negative.    Physical Exam Triage Vital Signs ED Triage Vitals  Enc Vitals Group     BP 06/04/20 1237 (!) 149/93     Pulse Rate 06/04/20 1237 74     Resp --      Temp 06/04/20 1237 (!) 97.1 F (36.2 C)     Temp Source 06/04/20 1237 Temporal     SpO2 06/04/20 1237 99 %     Weight --      Height --      Head Circumference --      Peak Flow --      Pain Score 06/04/20 1236 8     Pain Loc --      Pain Edu? --      Excl. in [place]? --    No data found.  Updated Vital Signs BP (!) 149/93   Pulse 74   Temp (!) 97.1 F (36.2 C) (Temporal)   LMP  (LMP  Unknown)   SpO2 99%   Visual Acuity Right Eye Distance:   Left Eye Distance:   Bilateral Distance:    Right Eye Near:   Left Eye Near:    Bilateral Near:     Physical Exam Vitals and nursing note reviewed.  Constitutional:      General: She is not in acute distress.    Appearance: She is well-developed.  HENT:     Head: Normocephalic and atraumatic.  Eyes:     Conjunctiva/sclera: Conjunctivae normal.  Cardiovascular:     Rate and Rhythm: Normal rate and regular rhythm.     Heart sounds: No murmur heard.   Pulmonary:     Effort: Pulmonary effort is normal. No respiratory distress.     Breath sounds: Normal breath sounds.  Abdominal:     Palpations: Abdomen is soft.     Tenderness: There is no abdominal tenderness.  Musculoskeletal:     Cervical back: Neck supple.  Skin:    General: Skin is warm and dry.  Neurological:      Mental Status: She is alert.      UC Treatments / Results  Labs (all labs ordered are listed, but only abnormal results are displayed) Labs Reviewed - No data to display  EKG   Radiology No results found.  Procedures Procedures (including critical care time)  Medications Ordered in UC Medications - No data to display  Initial Impression / Assessment and Plan / UC Course  I have reviewed the triage vital signs and the nursing notes.  Pertinent labs & imaging results that were available during my care of the patient were reviewed by me and considered in my medical decision making (see chart for details).     Will treat for sinusitis.  Recommend flonase and continuation of allergy medication.  Return precautions discussed.  Final Clinical Impressions(s) / UC Diagnoses   Final diagnoses:  Acute non-recurrent frontal sinusitis     Discharge Instructions     Use Flonase Continue with allergy medication  Can take ibuprofen as needed.    ED Prescriptions    Medication Sig Dispense Auth. Provider   amoxicillin-clavulanate (AUGMENTIN) 875-125 MG tablet Take 1 tablet by mouth every 12 (twelve) hours for 5 days. 10 tablet Konrad Felix, PA-C     PDMP not reviewed this encounter.   Konrad Felix, PA-C 06/04/20 1301

## 2020-06-04 NOTE — Discharge Instructions (Addendum)
Use Flonase Continue with allergy medication  Can take ibuprofen as needed.

## 2020-06-04 NOTE — ED Triage Notes (Signed)
Pt is present today with a sore throat and a cough. Pt states that her sx  Started Monday

## 2020-08-09 ENCOUNTER — Encounter (HOSPITAL_COMMUNITY): Payer: Self-pay | Admitting: Emergency Medicine

## 2020-08-09 ENCOUNTER — Ambulatory Visit (HOSPITAL_COMMUNITY)
Admission: EM | Admit: 2020-08-09 | Discharge: 2020-08-09 | Disposition: A | Discharge status: Home / Self Care | Payer: Self-pay | Attending: Urgent Care | Admitting: Urgent Care

## 2020-08-09 ENCOUNTER — Other Ambulatory Visit: Payer: Self-pay

## 2020-08-09 ENCOUNTER — Ambulatory Visit (INDEPENDENT_AMBULATORY_CARE_PROVIDER_SITE_OTHER): Payer: Self-pay

## 2020-08-09 DIAGNOSIS — M25521 Pain in right elbow: Secondary | ICD-10-CM

## 2020-08-09 DIAGNOSIS — M542 Cervicalgia: Secondary | ICD-10-CM

## 2020-08-09 DIAGNOSIS — G44209 Tension-type headache, unspecified, not intractable: Secondary | ICD-10-CM

## 2020-08-09 DIAGNOSIS — S161XXA Strain of muscle, fascia and tendon at neck level, initial encounter: Secondary | ICD-10-CM

## 2020-08-09 DIAGNOSIS — M25512 Pain in left shoulder: Secondary | ICD-10-CM

## 2020-08-09 MED ORDER — BACITRACIN ZINC 500 UNIT/GM EX OINT
TOPICAL_OINTMENT | CUTANEOUS | Status: AC
  Filled 2020-08-09: qty 28.35

## 2020-08-09 MED ORDER — IBUPROFEN 800 MG PO TABS
800.0000 mg | ORAL_TABLET | Freq: Once | ORAL | Status: AC
  Administered 2020-08-09: 800 mg via ORAL

## 2020-08-09 MED ORDER — IBUPROFEN 800 MG PO TABS
ORAL_TABLET | ORAL | Status: AC
  Filled 2020-08-09: qty 1

## 2020-08-09 MED ORDER — TIZANIDINE HCL 4 MG PO TABS: 4.0000 mg | ORAL_TABLET | Freq: Every day | ORAL | 0 refills | Status: DC

## 2020-08-09 MED ORDER — NAPROXEN 375 MG PO TABS: 375.0000 mg | ORAL_TABLET | Freq: Two times a day (BID) | ORAL | 0 refills | Status: DC
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
## 2022-06-24 ENCOUNTER — Ambulatory Visit (INDEPENDENT_AMBULATORY_CARE_PROVIDER_SITE_OTHER): Payer: Self-pay

## 2022-06-24 ENCOUNTER — Ambulatory Visit (HOSPITAL_COMMUNITY)
Admission: EM | Admit: 2022-06-24 | Discharge: 2022-06-24 | Disposition: A | Discharge status: Home / Self Care | Payer: Self-pay | Attending: Family Medicine | Admitting: Family Medicine

## 2022-06-24 ENCOUNTER — Encounter (HOSPITAL_COMMUNITY): Payer: Self-pay

## 2022-06-24 DIAGNOSIS — Z79899 Other long term (current) drug therapy: Secondary | ICD-10-CM | POA: Insufficient documentation

## 2022-06-24 DIAGNOSIS — J4521 Mild intermittent asthma with (acute) exacerbation: Secondary | ICD-10-CM

## 2022-06-24 DIAGNOSIS — J069 Acute upper respiratory infection, unspecified: Secondary | ICD-10-CM

## 2022-06-24 DIAGNOSIS — Z1152 Encounter for screening for COVID-19: Secondary | ICD-10-CM | POA: Insufficient documentation

## 2022-06-24 MED ORDER — BENZONATATE 100 MG PO CAPS: 100.0000 mg | ORAL_CAPSULE | Freq: Three times a day (TID) | ORAL | 0 refills | Status: AC | PRN

## 2022-06-24 MED ORDER — ALBUTEROL SULFATE HFA 108 (90 BASE) MCG/ACT IN AERS: 2.0000 | INHALATION_SPRAY | RESPIRATORY_TRACT | 0 refills | Status: AC | PRN

## 2022-06-24 MED ORDER — PREDNISONE 20 MG PO TABS: 40.0000 mg | ORAL_TABLET | Freq: Every day | ORAL | 0 refills | Status: AC

## 2022-06-24 NOTE — ED Triage Notes (Signed)
Patient here today with c/o cough since early Friday morning. She is also having some running nose, chest aches, wheeze, HA, fever, chills, and sweats. She has taking Tylenol and Robitussin which helped some but still feeling poorly. No sick contacts. No recent travel.

## 2022-06-24 NOTE — ED Provider Notes (Signed)
[facility]    CSN: [id number] Arrival date & time: 06/24/22  1116      History   Chief Complaint Chief Complaint  Patient presents with   Cough    HPI Ashley Bruce is a 57 y.o. female.    Cough   Here for cough and congestion, shortness of breath and wheezing, chills, and nausea.  Symptoms began on May 3.  She has not had any fever.  She has been taking Tylenol for the headache . Her chart lists that she has Tylenol as an allergy.  We have discussed this today, and she states that in the past she thought she had a rash with Tylenol but she has not had a rash develop since she has been taking Tylenol with this illness  No vomiting or diarrhea  She has had a history of asthma a long time ago  Past Medical History:  Diagnosis Date   Anemia    Fibroids    Hypertension     Patient Active Problem List   Diagnosis Date Noted   Syncope 04/20/2018   Diarrhea 12/15/2014   S/P laparoscopic cholecystectomy 12/07/2013    Past Surgical History:  Procedure Laterality Date   CESAREAN SECTION     CHOLECYSTECTOMY  12/07/2013   CHOLECYSTECTOMY N/A 12/07/2013   Procedure: LAPAROSCOPIC CHOLECYSTECTOMY WITH INTRAOPERATIVE CHOLANGIOGRAM;  Surgeon: Abigail Miyamoto, MD;  Location: [facility];  Service: General;  Laterality: N/A;    OB History   No obstetric history on file.      Home Medications    Prior to Admission medications   Medication Sig Start Date End Date Taking? Authorizing Provider  albuterol (VENTOLIN HFA) 108 (90 Base) MCG/ACT inhaler Inhale 2 puffs into the lungs every 4 (four) hours as needed for wheezing or shortness of breath. 06/24/22  Yes Zenia Resides, MD  benzonatate (TESSALON) 100 MG capsule Take 1 capsule (100 mg total) by mouth 3 (three) times daily as needed for cough. 06/24/22  Yes Zenia Resides, MD  lisinopril (PRINIVIL,ZESTRIL) 5 MG tablet Take 10 mg by mouth daily.   Yes [provider]  predniSONE (DELTASONE) 20 MG  tablet Take 2 tablets (40 mg total) by mouth daily with breakfast for 5 days. 06/24/22 06/29/22 Yes Zenia Resides, MD  IBUPROFEN PO Take by mouth.    [provider]    Family History History reviewed. No pertinent family history.  Social History Social History   Tobacco Use   Smoking status: Never   Smokeless tobacco: Never  Substance Use Topics   Alcohol use: No   Drug use: No     Allergies   Tylenol [acetaminophen]   Review of Systems Review of Systems  Respiratory:  Positive for cough.      Physical Exam Triage Vital Signs ED Triage Vitals  Enc Vitals Group     BP 06/24/22 1230 (!) 147/90     Pulse Rate 06/24/22 1230 (!) 110     Resp 06/24/22 1230 16     Temp 06/24/22 1230 99.3 F (37.4 C)     Temp Source 06/24/22 1230 Oral     SpO2 06/24/22 1230 97 %     Weight 06/24/22 1229 125 lb (56.7 kg)     Height 06/24/22 1229 4\' 11"  (1.499 m)     Head Circumference --      Peak Flow --      Pain Score 06/24/22 1228 8     Pain Loc --  Pain Edu? --      Excl. in [place]? --    No data found.  Updated Vital Signs BP (!) 147/90 (BP Location: Left Arm)   Pulse (!) 110   Temp 99.3 F (37.4 C) (Oral)   Resp 16   Ht 4\' 11"  (1.499 m)   Wt 56.7 kg   LMP  (LMP Unknown)   SpO2 97%   BMI 25.25 kg/m   Visual Acuity Right Eye Distance:   Left Eye Distance:   Bilateral Distance:    Right Eye Near:   Left Eye Near:    Bilateral Near:     Physical Exam Vitals reviewed.  Constitutional:      General: She is not in acute distress.    Appearance: She is not toxic-appearing.  HENT:     Right Ear: Tympanic membrane and ear canal normal.     Left Ear: Tympanic membrane and ear canal normal.     Nose: Congestion present.     Mouth/Throat:     Mouth: Mucous membranes are moist.     Pharynx: No oropharyngeal exudate or posterior oropharyngeal erythema.  Eyes:     Extraocular Movements: Extraocular movements intact.     Conjunctiva/sclera: Conjunctivae  normal.     Pupils: Pupils are equal, round, and reactive to light.  Cardiovascular:     Rate and Rhythm: Normal rate and regular rhythm.     Heart sounds: No murmur heard. Pulmonary:     Effort: No respiratory distress.     Breath sounds: No stridor. No rhonchi or rales.     Comments: There is some wheezing when she coughs.  No rhonchi or rales are heard Chest:     Chest wall: No tenderness.  Musculoskeletal:     Cervical back: Neck supple.  Lymphadenopathy:     Cervical: No cervical adenopathy.  Skin:    Capillary Refill: Capillary refill takes less than 2 seconds.     Coloration: Skin is not jaundiced or pale.  Neurological:     General: No focal deficit present.     Mental Status: She is alert and oriented to person, place, and time.  Psychiatric:        Behavior: Behavior normal.      UC Treatments / Results  Labs (all labs ordered are listed, but only abnormal results are displayed) Labs Reviewed  SARS CORONAVIRUS 2 (TAT 6-24 HRS)  CREATININE, SERUM    EKG   Radiology DG Chest 2 View  Result Date: 06/24/2022 CLINICAL DATA:  Shortness of breath, cough and wheezing for 2 days. EXAM: CHEST - 2 VIEW COMPARISON:  Chest radiograph dated April 20, 2018 FINDINGS: The heart size and mediastinal contours are within normal limits. Both lungs are clear. The visualized skeletal structures are unremarkable. IMPRESSION: No active cardiopulmonary disease. Electronically Signed   By: Larose Hires D.O.   On: 06/24/2022 13:24    Procedures Procedures (including critical care time)  Medications Ordered in UC Medications - No data to display  Initial Impression / Assessment and Plan / UC Course  I have reviewed the triage vital signs and the nursing notes.  Pertinent labs & imaging results that were available during my care of the patient were reviewed by me and considered in my medical decision making (see chart for details).        Chest x-ray is clear. I did treat for  asthma exacerbation with albuterol and prednisone. COVID swab is done and if positive, she has a  candidate for Paxlovid if her creatinine is normal that we did today.  I also discussed the [employer] program with the pt and her family    Final Clinical Impressions(s) / UC Diagnoses   Final diagnoses:  Viral URI with cough  Mild intermittent asthma with exacerbation     Discharge Instructions      The chest x-ray was clear and did not show pneumonia or fluid  Albuterol inhaler--do 2 puffs every 4 hours as needed for shortness of breath or wheezing; (Haga 2 inhalaciones con la bombita cada 4 horas si le falta aire o tiene tos apretado)  Take prednisone 20 mg--2 daily for 5 days;  (Tome 2 tabletas por la boca diaramente por 5 dias)  Take benzonatate 100 mg, 1 tab every 8 hours as needed for cough.  (Tome 1 capsula 3 veces al dia cuando tiene tos)  You have been swabbed for COVID, and the test will result in the next 24 hours. Our staff will call you if positive. If the COVID test is positive, you should quarantine until you are fever free for 24 hours and you are starting to feel better, and then take added precautions for the next 5 days, such as physical distancing/wearing a mask and good hand hygiene/washing. (Hemos hecho una prueba de COVID. Si es positiva, las enfermeras le hablarian a decirle. Si es positiva, Ud debe hacer cuarentena por 5 dias del primer dia que Ud tuvo las sintomas)  We have also drawn blood to check your kidney function, so we know if it is safe to give you certain medicines if you have COVID infection.( Hemos tambien sacado sangre para comprobar la funcion de los rinones, para que podemos saber cuales medicinas estan bien para UD a tomar)  If you need Paxlovid prescription for COVID, please put in [employer] into your internet search engine, to get assistance in obtaining that prescription medicine (si Ud necesitaria Paxlovid para la COVID, por favor ponga [employer]   para buscar el sitio del web para ayuda para Pension scheme manager)     ED Prescriptions     Medication Sig Dispense Auth. Provider   albuterol (VENTOLIN HFA) 108 (90 Base) MCG/ACT inhaler Inhale 2 puffs into the lungs every 4 (four) hours as needed for wheezing or shortness of breath. 1 each Zenia Resides, MD   benzonatate (TESSALON) 100 MG capsule Take 1 capsule (100 mg total) by mouth 3 (three) times daily as needed for cough. 21 capsule Zenia Resides, MD   predniSONE (DELTASONE) 20 MG tablet Take 2 tablets (40 mg total) by mouth daily with breakfast for 5 days. 10 tablet Marlinda Mike Janace Aris, MD      PDMP not reviewed this encounter.   Zenia Resides, MD 06/24/22 1350

## 2022-06-24 NOTE — Discharge Instructions (Addendum)
The chest x-ray was clear and did not show pneumonia or fluid  Albuterol inhaler--do 2 puffs every 4 hours as needed for shortness of breath or wheezing; (Haga 2 inhalaciones con la bombita cada 4 horas si le falta aire o tiene tos apretado)  Take prednisone 20 mg--2 daily for 5 days;  (Tome 2 tabletas por la boca diaramente por 5 dias)  Take benzonatate 100 mg, 1 tab every 8 hours as needed for cough.  (Tome 1 capsula 3 veces al dia cuando tiene tos)  You have been swabbed for COVID, and the test will result in the next 24 hours. Our staff will call you if positive. If the COVID test is positive, you should quarantine until you are fever free for 24 hours and you are starting to feel better, and then take added precautions for the next 5 days, such as physical distancing/wearing a mask and good hand hygiene/washing. (Hemos hecho una prueba de COVID. Si es positiva, las enfermeras le hablarian a decirle. Si es positiva, Ud debe hacer cuarentena por 5 dias del primer dia que Ud tuvo las sintomas)  We have also drawn blood to check your kidney function, so we know if it is safe to give you certain medicines if you have COVID infection.( Hemos tambien sacado sangre para comprobar la funcion de los rinones, para que podemos saber cuales medicinas estan bien para UD a tomar)  If you need Paxlovid prescription for COVID, please put in [employer] into your internet search engine, to get assistance in obtaining that prescription medicine (si Ud necesitaria Paxlovid para la COVID, por favor ponga [employer]  para buscar el sitio del web para ayuda para Pension scheme manager)

## 2022-06-25 LAB — SARS CORONAVIRUS 2 (TAT 6-24 HRS): SARS Coronavirus 2: NEGATIVE
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
